# Patient Record
Sex: Male | Born: 1945 | Race: White | Hispanic: No | Marital: Married | Smoking: Former smoker
Health system: Southern US, Community
[De-identification: ages and names within clinical notes are randomized; demographics above are authoritative.]

## PROBLEM LIST (undated history)

## (undated) DIAGNOSIS — I10 Essential (primary) hypertension: Secondary | ICD-10-CM

## (undated) DIAGNOSIS — E785 Hyperlipidemia, unspecified: Secondary | ICD-10-CM

---

## 2009-01-19 ENCOUNTER — Emergency Department: Payer: Self-pay | Admitting: Internal Medicine

## 2009-05-07 ENCOUNTER — Ambulatory Visit: Payer: Self-pay | Admitting: Gastroenterology

## 2011-06-02 ENCOUNTER — Emergency Department: Payer: Self-pay | Admitting: *Deleted

## 2013-01-11 IMAGING — CT CT HEAD WITHOUT CONTRAST
2 series · 15 of 30 positions shown, 19 images · non-contrast
Comparison: none

REASON FOR EXAM: facial weakness
COMMENTS:

[Series 2: without · axial · non-contrast · 0.43mm/px · z∈[+943,+1073]mm · 13 of 32 slices shown, 17 images]
[im 3/32  brain]
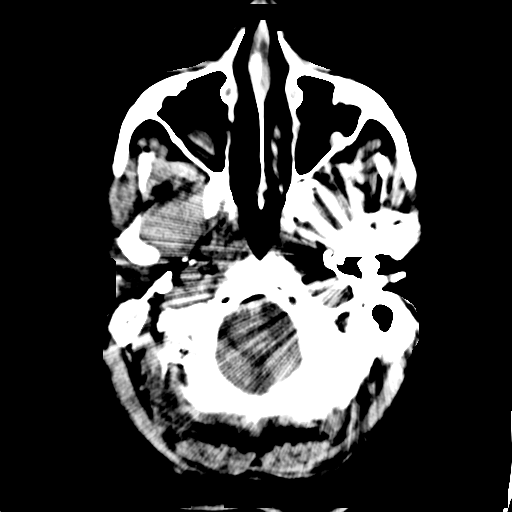
[im 3/32  bone]
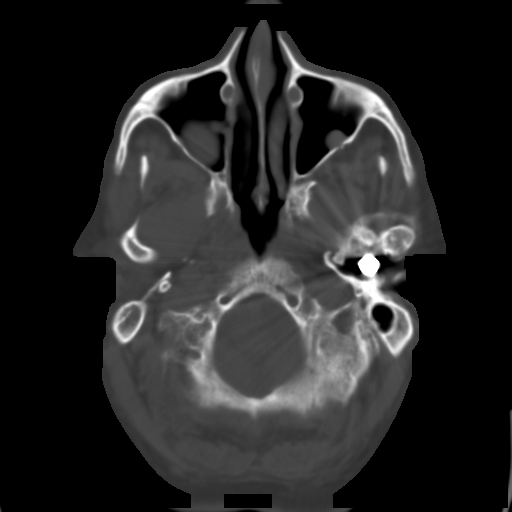
[im 5/32  brain]
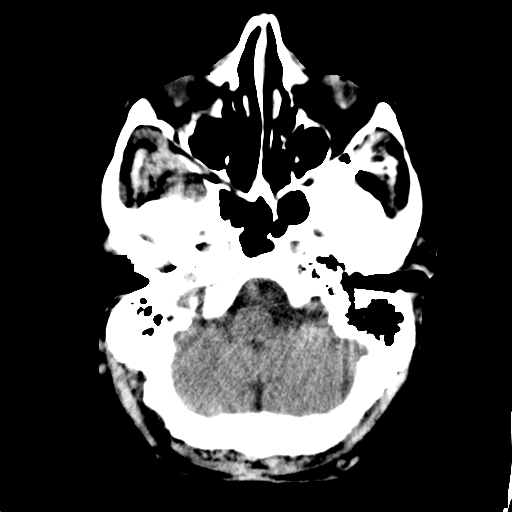
[im 7/32  brain]
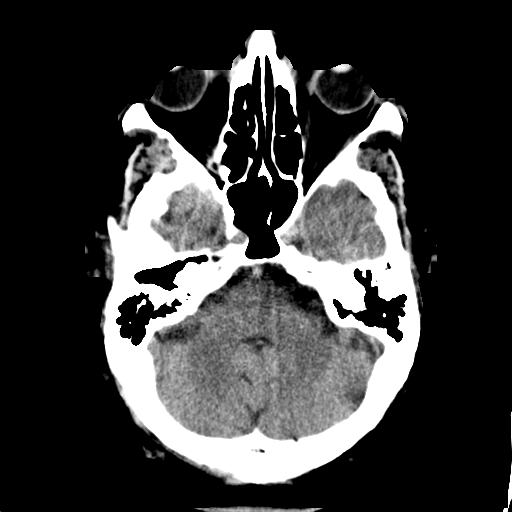
[im 9/32  brain]
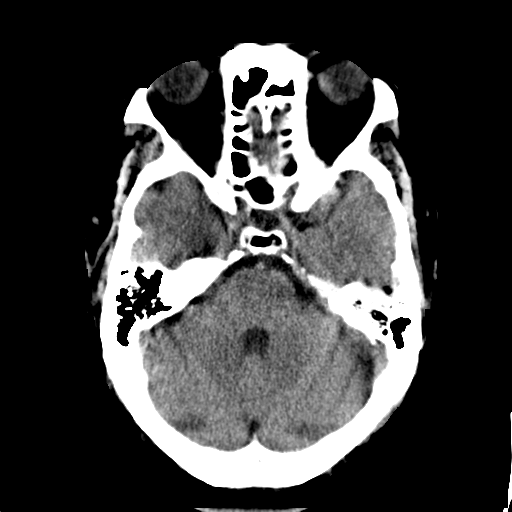
[im 12/32  brain]
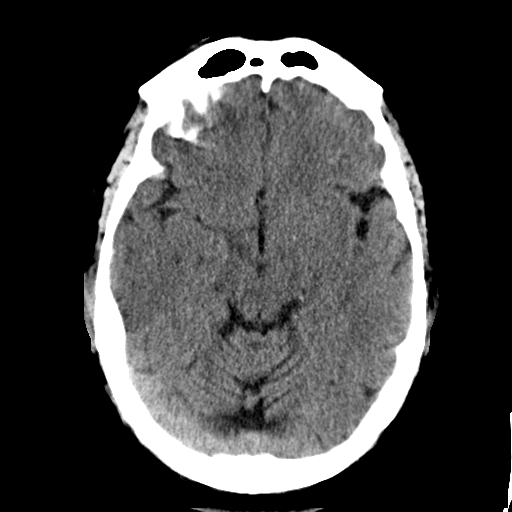
[im 12/32  bone]
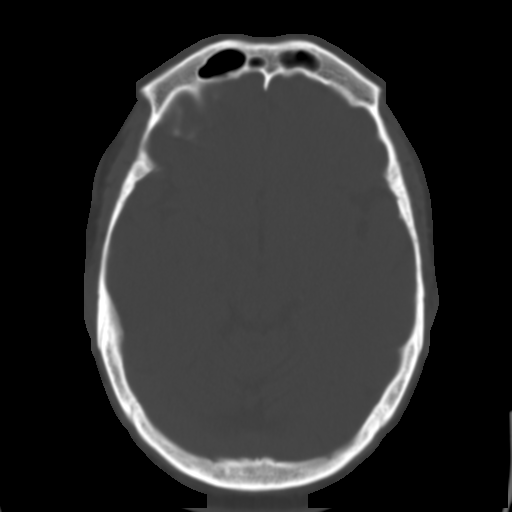
[im 14/32  brain]
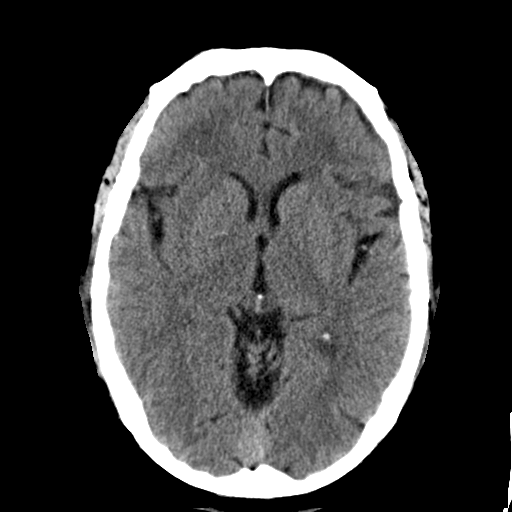
[im 16/32  brain]
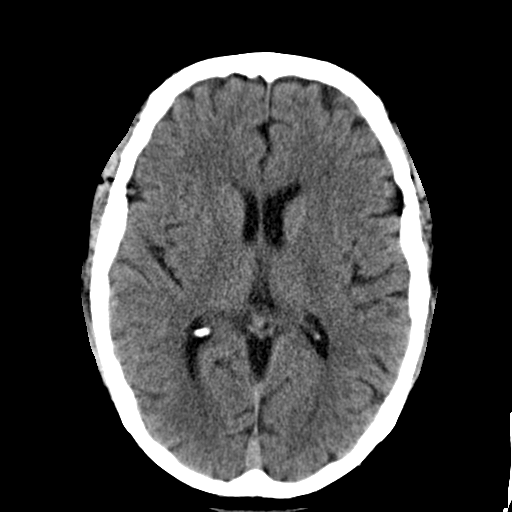
[im 18/32  brain]
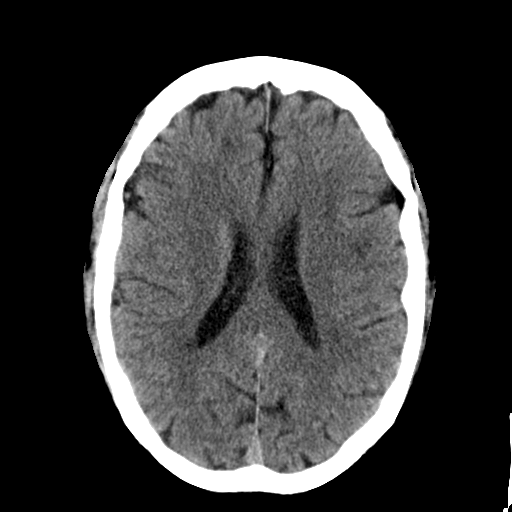
[im 20/32  brain]
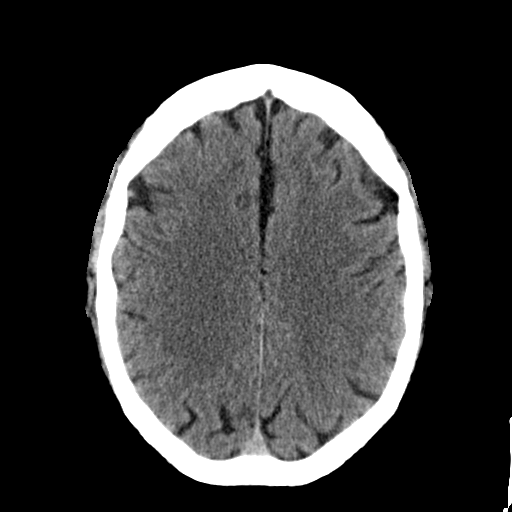
[im 20/32  bone]
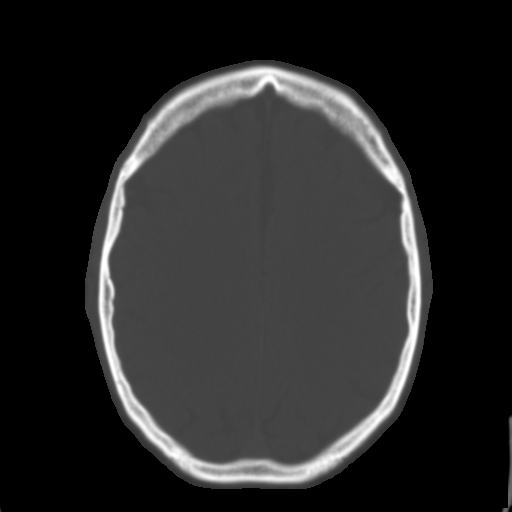
[im 23/32  brain]
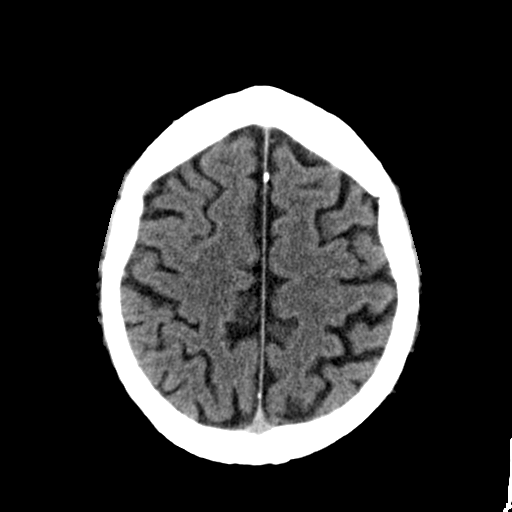
[im 25/32  brain]
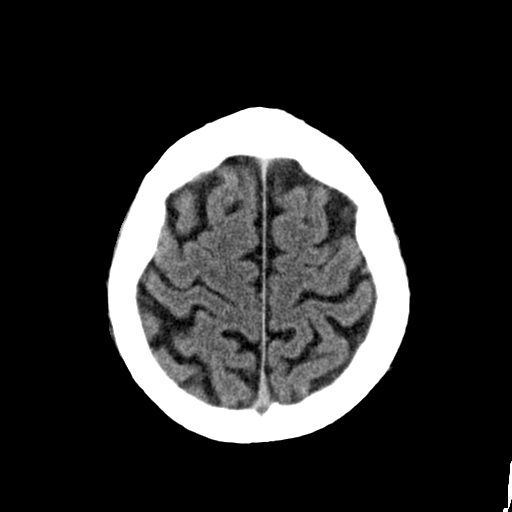
[im 27/32  brain]
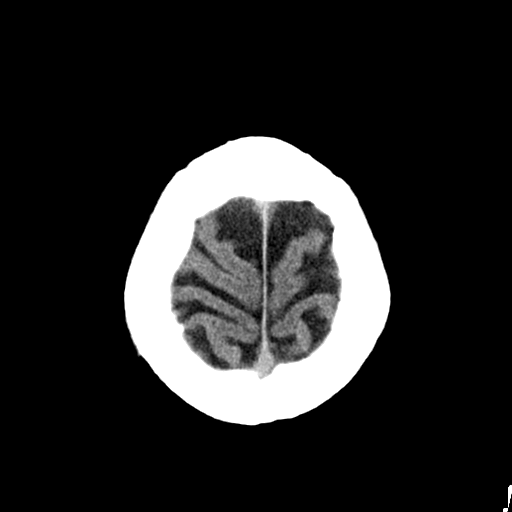
[im 29/32  brain]
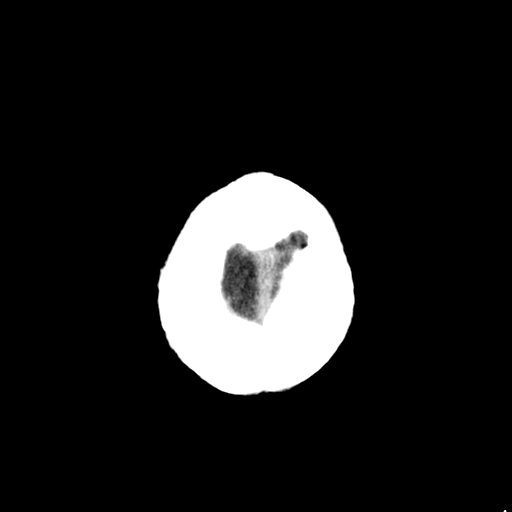
[im 29/32  bone]
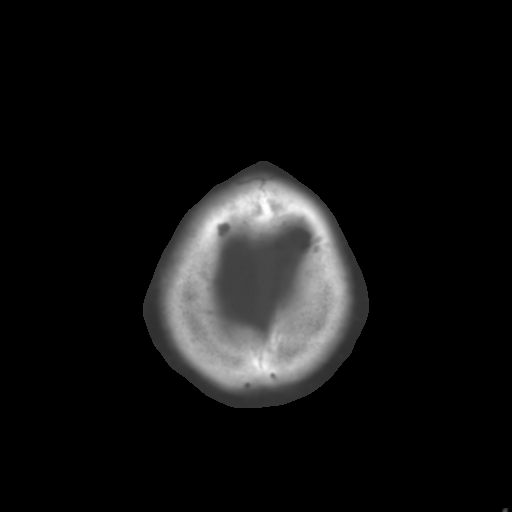

[Series 3: bone · axial · 0.43mm/px · z∈[+943,+963]mm · 2 of 32 slices shown]
[im 3/32  bone]
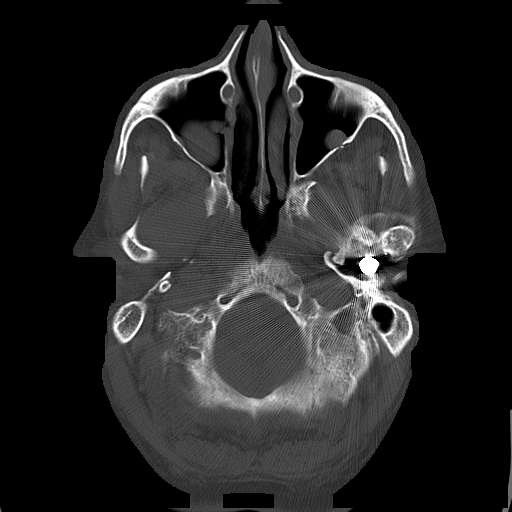
[im 7/32  bone]
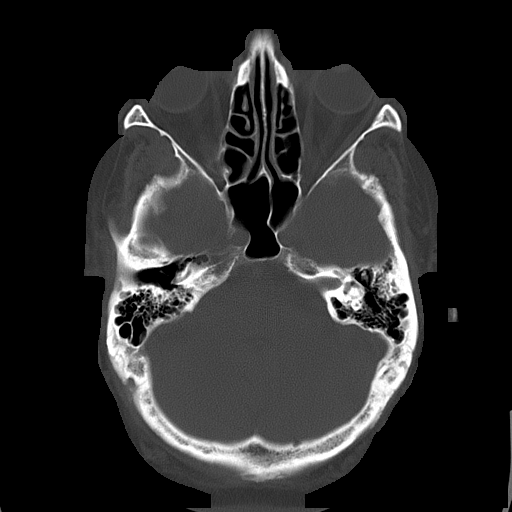

[15 of 30 positions shown; findings below may reference images not displayed]

PROCEDURE:     CT  - CT HEAD WITHOUT CONTRAST  - June 02, 2011  [DATE]

RESULT:     Axial noncontrast CT scanning was performed through the brain at
5 mm intervals and slice thicknesses.

The ventricles are normal in size and position. There is mild diffuse
cerebral and cerebellar atrophy. There is no shift of the midline. There is
no intracranial hemorrhage. I see no evidence of an evolving ischemic
infarction.

At bone window settings there is mucoperiosteal thickening in both maxillary
sinuses. The frontal and ethmoid and sphenoid sinuses are clear. The mastoid
air cells are well pneumatized. The middle ear cavities do not appear
abnormally opacified. There is no evidence of an acute skull fracture.
IMPRESSION: 1. I do not see evidence of an acute ischemic or hemorrhagic infarction.
2. There is no evidence of intracranial mass effect.
3. There is mild diffuse cerebral atrophy.

Followup imaging is available if there are strong clinical concerns of an
evolving ischemic infarction rather than development of Bell's palsy.

## 2015-02-26 ENCOUNTER — Ambulatory Visit: Admit: 2015-02-26 | Disposition: A | Discharge status: Home / Self Care | Payer: Self-pay | Admitting: Gastroenterology

## 2017-06-10 ENCOUNTER — Other Ambulatory Visit: Payer: Self-pay | Admitting: Family Medicine

## 2017-06-10 ENCOUNTER — Ambulatory Visit
Admission: RE | Admit: 2017-06-10 | Discharge: 2017-06-10 | Disposition: A | Discharge status: Home / Self Care | Payer: Medicare Other | Source: Ambulatory Visit | Attending: Family Medicine | Admitting: Family Medicine

## 2017-06-10 DIAGNOSIS — M79662 Pain in left lower leg: Secondary | ICD-10-CM | POA: Insufficient documentation

## 2017-06-10 DIAGNOSIS — R600 Localized edema: Secondary | ICD-10-CM | POA: Diagnosis not present

## 2017-11-24 IMAGING — US US EXTREM LOW VENOUS*L*
1 series · 13 of 24 positions shown · non-contrast
Comparison: None.

CLINICAL DATA: Left calf pain and edema.



[Series 1: us extrem low venous*left* · 0.09mm/px · 13 of 33 slices shown]
[im 1/33]
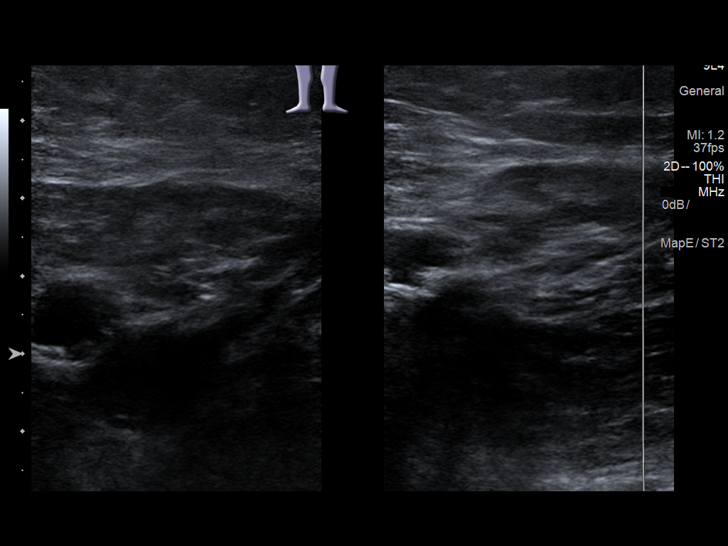
[im 3/33]
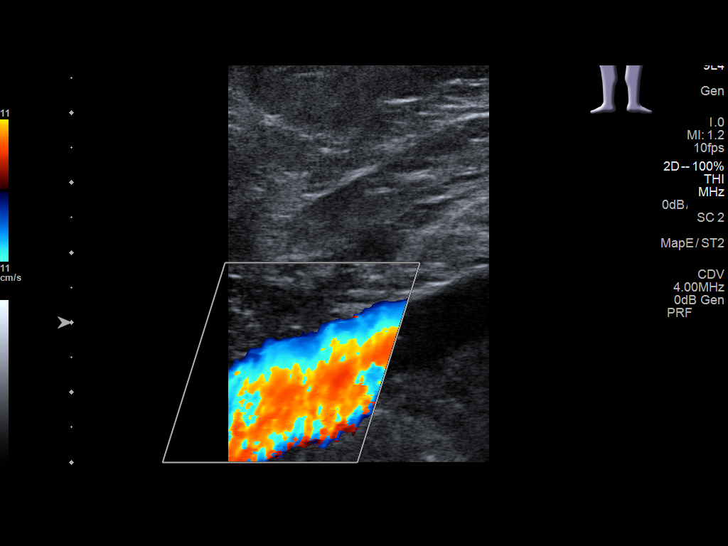
[im 6/33]
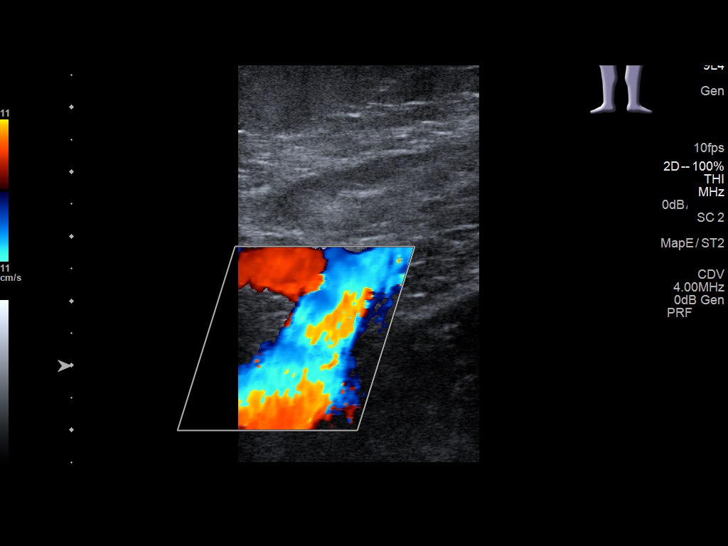
[im 9/33]
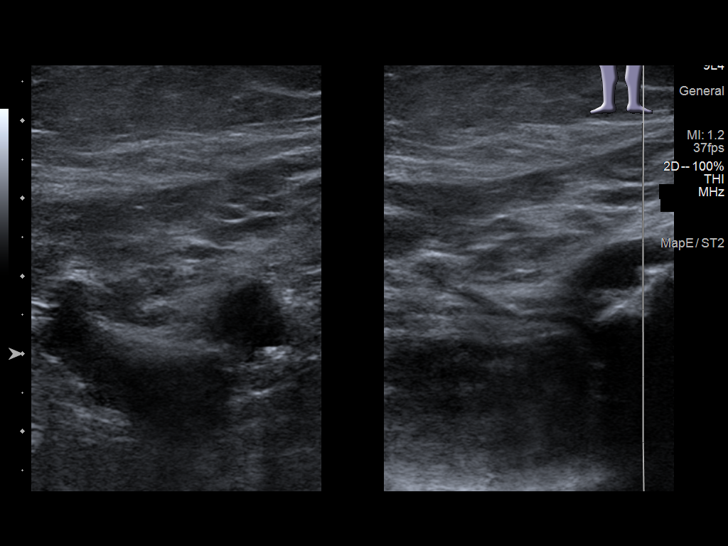
[im 12/33]
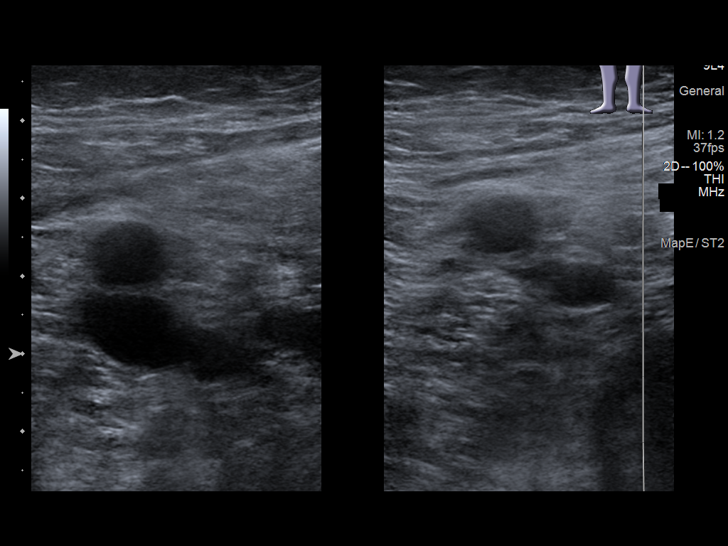
[im 14/33]
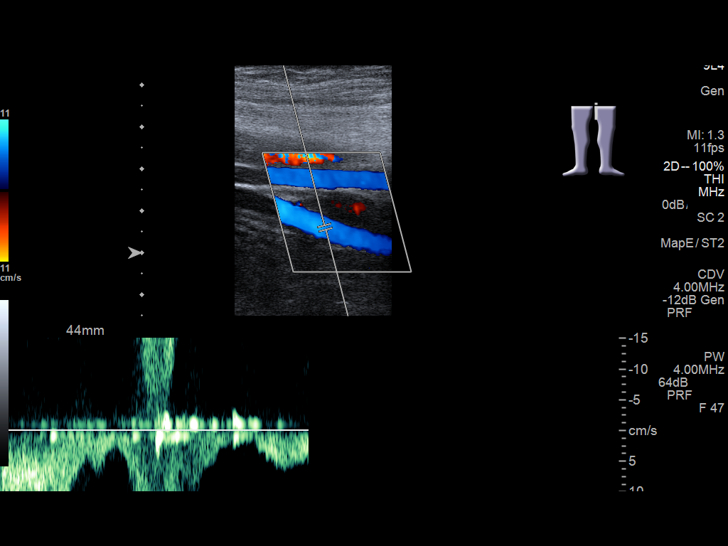
[im 17/33]
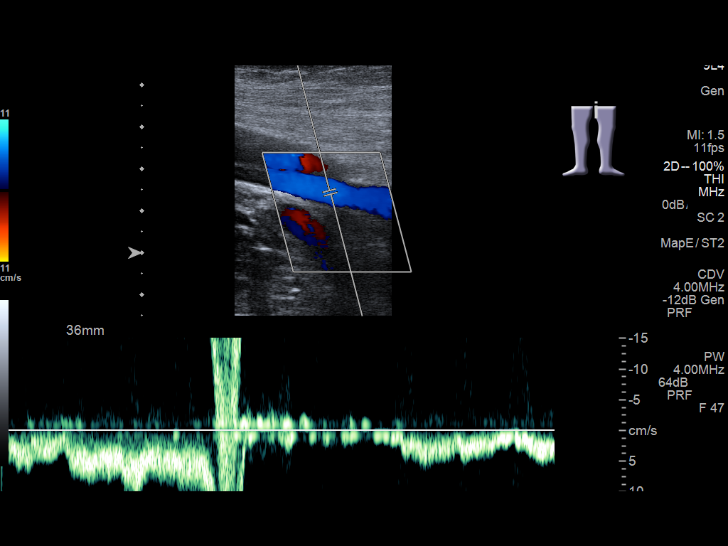
[im 19/33]
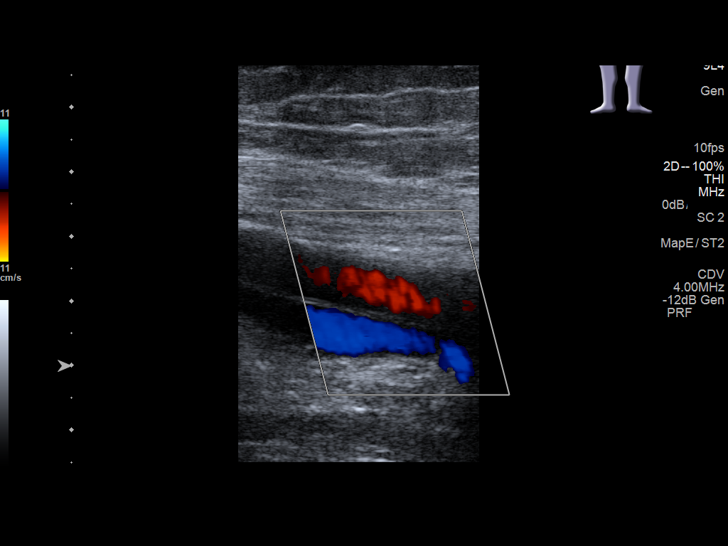
[im 21/33]
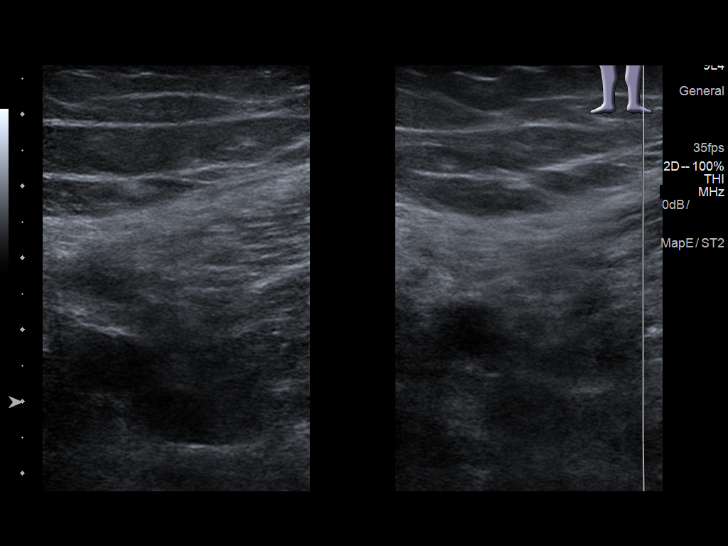
[im 24/33]
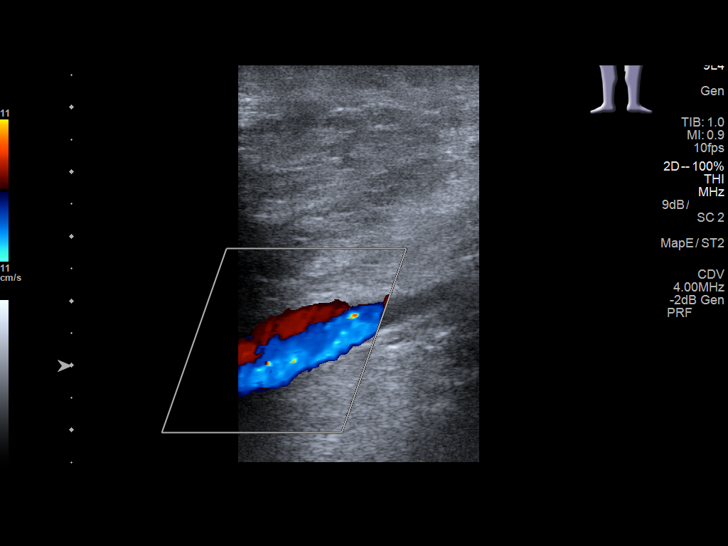
[im 27/33]
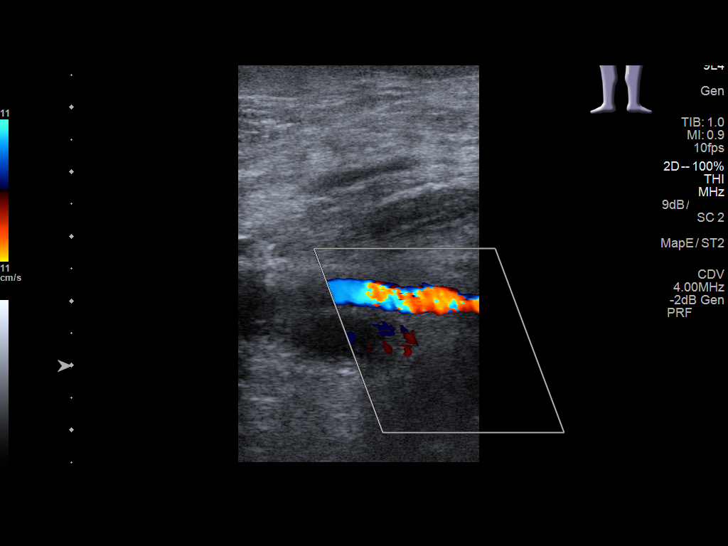
[im 30/33]
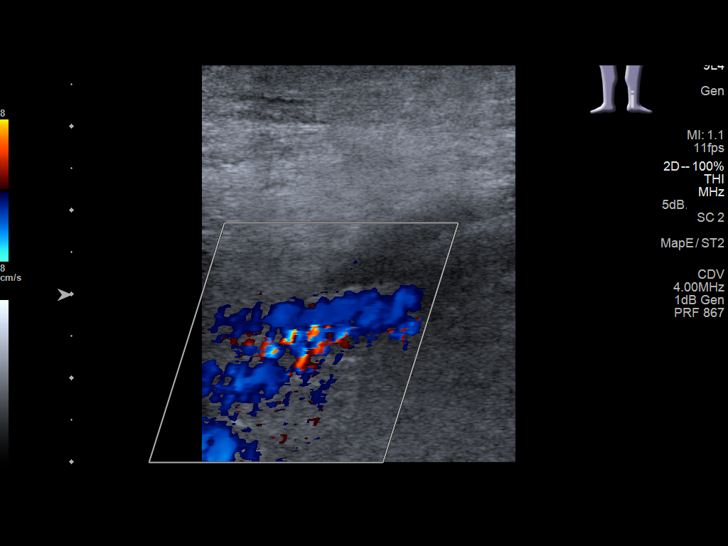
[im 33/33]
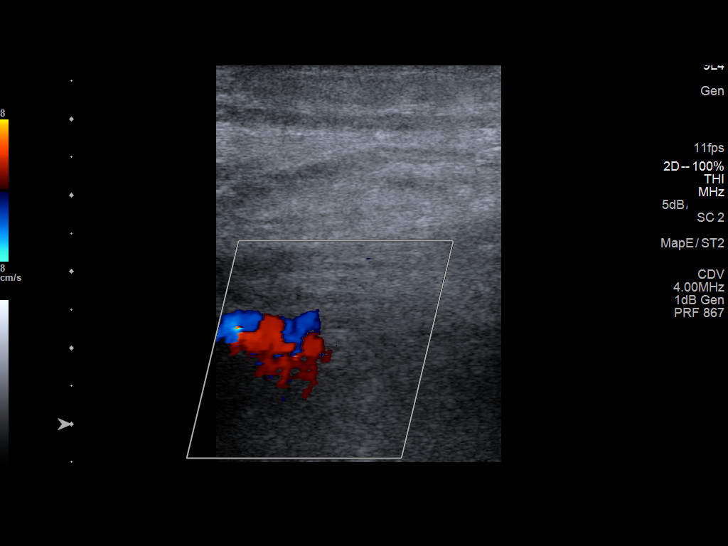

[13 of 24 positions shown; findings below may reference images not displayed]

FINDINGS: Contralateral Common Femoral Vein: Respiratory phasicity is normal
and symmetric with the symptomatic side. No evidence of thrombus.
Normal compressibility.

Common Femoral Vein: No evidence of thrombus. Normal
compressibility, respiratory phasicity and response to augmentation.

Saphenofemoral Junction: No evidence of thrombus. Normal
compressibility and flow on color Doppler imaging.

Profunda Femoral Vein: No evidence of thrombus. Normal
compressibility and flow on color Doppler imaging.

Femoral Vein: No evidence of thrombus. Normal compressibility,
respiratory phasicity and response to augmentation.

Popliteal Vein: No evidence of thrombus. Normal compressibility,
respiratory phasicity and response to augmentation.

Calf Veins: No evidence of thrombus. Normal compressibility and flow
on color Doppler imaging.

Venous Reflux:  None.

Other Findings:  None.
IMPRESSION: No evidence of DVT within the left lower extremity.

## 2020-01-31 ENCOUNTER — Other Ambulatory Visit: Payer: Self-pay | Admitting: General Surgery

## 2020-02-01 LAB — SURGICAL PATHOLOGY

## 2020-02-03 ENCOUNTER — Encounter
Admission: EM | Disposition: A | Discharge status: Other Acute Inpatient Hospital | Payer: Self-pay | Source: Home / Self Care | Attending: Internal Medicine

## 2020-02-03 ENCOUNTER — Other Ambulatory Visit: Payer: Self-pay

## 2020-02-03 ENCOUNTER — Encounter: Payer: Self-pay | Admitting: Internal Medicine

## 2020-02-03 ENCOUNTER — Inpatient Hospital Stay
Admission: EM | Admit: 2020-02-03 | Discharge: 2020-02-04 | DRG: 262 | Disposition: A | Discharge status: Other Acute Inpatient Hospital | Payer: Medicare Other | Attending: Internal Medicine | Admitting: Internal Medicine

## 2020-02-03 DIAGNOSIS — Z87891 Personal history of nicotine dependence: Secondary | ICD-10-CM

## 2020-02-03 DIAGNOSIS — M1990 Unspecified osteoarthritis, unspecified site: Secondary | ICD-10-CM | POA: Diagnosis present

## 2020-02-03 DIAGNOSIS — K5790 Diverticulosis of intestine, part unspecified, without perforation or abscess without bleeding: Secondary | ICD-10-CM | POA: Diagnosis present

## 2020-02-03 DIAGNOSIS — Z20822 Contact with and (suspected) exposure to covid-19: Secondary | ICD-10-CM | POA: Diagnosis present

## 2020-02-03 DIAGNOSIS — E669 Obesity, unspecified: Secondary | ICD-10-CM | POA: Diagnosis present

## 2020-02-03 DIAGNOSIS — R55 Syncope and collapse: Secondary | ICD-10-CM | POA: Diagnosis present

## 2020-02-03 DIAGNOSIS — I10 Essential (primary) hypertension: Secondary | ICD-10-CM | POA: Diagnosis present

## 2020-02-03 DIAGNOSIS — I442 Atrioventricular block, complete: Principal | ICD-10-CM | POA: Diagnosis present

## 2020-02-03 DIAGNOSIS — Z6829 Body mass index (BMI) 29.0-29.9, adult: Secondary | ICD-10-CM | POA: Diagnosis not present

## 2020-02-03 DIAGNOSIS — I959 Hypotension, unspecified: Secondary | ICD-10-CM | POA: Diagnosis present

## 2020-02-03 DIAGNOSIS — E785 Hyperlipidemia, unspecified: Secondary | ICD-10-CM | POA: Diagnosis present

## 2020-02-03 HISTORY — DX: Essential (primary) hypertension: I10

## 2020-02-03 HISTORY — DX: Atrioventricular block, complete: I44.2

## 2020-02-03 HISTORY — PX: TEMPORARY PACEMAKER: CATH118268

## 2020-02-03 HISTORY — DX: Hyperlipidemia, unspecified: E78.5

## 2020-02-03 LAB — TROPONIN I (HIGH SENSITIVITY)
Troponin I (High Sensitivity): 27 ng/L — ABNORMAL HIGH (ref <18)
Troponin I (High Sensitivity): 28 ng/L — ABNORMAL HIGH (ref <18)

## 2020-02-03 LAB — COMPREHENSIVE METABOLIC PANEL
ALT: 19 U/L (ref 0–44)
AST: 18 U/L (ref 15–41)
Albumin: 4 g/dL (ref 3.5–5.0)
Alkaline Phosphatase: 43 U/L (ref 38–126)
Anion gap: 6 (ref 5–15)
BUN: 55 mg/dL — ABNORMAL HIGH (ref 8–23)
CO2: 25 mmol/L (ref 22–32)
Calcium: 10.6 mg/dL — ABNORMAL HIGH (ref 8.9–10.3)
Chloride: 110 mmol/L (ref 98–111)
Creatinine, Ser: 1.69 mg/dL — ABNORMAL HIGH (ref 0.61–1.24)
GFR calc Af Amer: 45 mL/min — ABNORMAL LOW (ref >60)
GFR calc non Af Amer: 39 mL/min — ABNORMAL LOW (ref >60)
Glucose, Bld: 128 mg/dL — ABNORMAL HIGH (ref 70–99)
Potassium: 4.9 mmol/L (ref 3.5–5.1)
Sodium: 141 mmol/L (ref 135–145)
Total Bilirubin: 0.5 mg/dL (ref 0.3–1.2)
Total Protein: 6.4 g/dL — ABNORMAL LOW (ref 6.5–8.1)

## 2020-02-03 LAB — CBC
HCT: 40.5 % (ref 39.0–52.0)
Hemoglobin: 13.1 g/dL (ref 13.0–17.0)
MCH: 30.7 pg (ref 26.0–34.0)
MCHC: 32.3 g/dL (ref 30.0–36.0)
MCV: 94.8 fL (ref 80.0–100.0)
Platelets: 227 10*3/uL (ref 150–400)
RBC: 4.27 MIL/uL (ref 4.22–5.81)
RDW: 13.4 % (ref 11.5–15.5)
WBC: 10.2 10*3/uL (ref 4.0–10.5)
nRBC: 0 % (ref 0.0–0.2)

## 2020-02-03 LAB — GLUCOSE, CAPILLARY: Glucose-Capillary: 84 mg/dL (ref 70–99)

## 2020-02-03 LAB — TSH: TSH: 5.946 u[IU]/mL — ABNORMAL HIGH (ref 0.350–4.500)

## 2020-02-03 LAB — RESPIRATORY PANEL BY RT PCR (FLU A&B, COVID)
Influenza A by PCR: NEGATIVE
Influenza B by PCR: NEGATIVE
SARS Coronavirus 2 by RT PCR: NEGATIVE

## 2020-02-03 SURGERY — TEMPORARY PACEMAKER

## 2020-02-03 MED ORDER — DOPAMINE-DEXTROSE 3.2-5 MG/ML-% IV SOLN: 0.0000 ug/kg/min | INTRAVENOUS | Status: DC

## 2020-02-03 MED ORDER — ONDANSETRON HCL 4 MG/2ML IJ SOLN: 4.0000 mg | Freq: Four times a day (QID) | INTRAMUSCULAR | Status: DC | PRN

## 2020-02-03 MED ORDER — BUPIVACAINE HCL (PF) 0.5 % IJ SOLN
INTRAMUSCULAR | Status: AC
  Filled 2020-02-03: qty 30

## 2020-02-03 MED ORDER — ACETAMINOPHEN 325 MG PO TABS: 650.0000 mg | ORAL_TABLET | ORAL | Status: DC | PRN

## 2020-02-03 MED ORDER — SODIUM CHLORIDE 0.9 % IV BOLUS
1000.0000 mL | Freq: Once | INTRAVENOUS | Status: AC
  Administered 2020-02-03: 1000 mL via INTRAVENOUS

## 2020-02-03 MED ORDER — HEPARIN (PORCINE) IN NACL 1000-0.9 UT/500ML-% IV SOLN
INTRAVENOUS | Status: DC | PRN
  Administered 2020-02-03: 500 mL

## 2020-02-03 MED ORDER — BUPIVACAINE HCL (PF) 0.5 % IJ SOLN
INTRAMUSCULAR | Status: DC | PRN
  Administered 2020-02-03: 12 mL

## 2020-02-03 SURGICAL SUPPLY — 7 items
CABLE ADAPT PACING TEMP 12FT (ADAPTER) ×3 IMPLANT
KIT MANI 3VAL PERCEP (MISCELLANEOUS) IMPLANT
NEEDLE PERC 18GX7CM (NEEDLE) ×3 IMPLANT
PACK CARDIAC CATH (CUSTOM PROCEDURE TRAY) ×3 IMPLANT
SHEATH AVANTI 6FR X 11CM (SHEATH) ×3 IMPLANT
SLEEVE REPOSITIONING LENGTH 30 (MISCELLANEOUS) ×3 IMPLANT
WIRE PACING TEMP ST TIP 5 (CATHETERS) ×3 IMPLANT

## 2020-02-03 NOTE — ED Notes (Signed)
Physical copy of transfer consent form signed and sent to patient records.

## 2020-02-03 NOTE — H&P (Signed)
Name: Bill Young MRN: [id number] DOB: Jan 25, 1946    ADMISSION DATE:  02/03/2020  REFERRING MD : Dr. Kerman Passey   CHIEF COMPLAINT: Syncopal Episode   BRIEF PATIENT DESCRIPTION:  74 yo male admitted with third degree heart block requiring temporary transvenous pacemaker placement   SIGNIFICANT EVENTS/STUDIES:  03/26: Pt presented to [facility] ER following a syncopal episode, lightheadedness, and dizziness  03/26: Pt admitted to [facility] ICU with third degree heart block pending transfer to [facility] for permanent pacemaker placement (accepting physician Dr. Stanford Breed) 03/26: Pt remained bradycardic heart rate in the 20's and manual bp 104/48, activated STEMI pager pt transported to cath lab and right internal jugular temporary pacemaker placed by Dr. Saralyn Pilar   HISTORY OF PRESENT ILLNESS:   This is a 74 yo male with a PMH of Hyperlipidemia, HTN, Former Smoker, Diverticulosis, Degenerative Arthritis, and Obesity.  He presented to [facility] ER on 03/26 with c/o worsening dizziness, headaches, and lightheadedness onset of symptoms 2 days prior to presentation. The pt also reported having a syncopal episode the night of 03/25, however he is uncertain how long he was unconscious but suspects it was at least an hour.  Upon EMS arrival at pts home pts hr in the 20's, EMS administered 1 mg of iv atropine. However, he remained bradycardic hr in the 20's. He does take outpatient antihypertensives which include: 2.5 mg of amlodipine daily/losartan 100 mg daily and endorses taking vitamin C and B3. He denies taking beta blockers. He reports he had a cyst lanced on 03/23 and received subq lidocaine during the procedure, when he arrived at home he developed diaphoresis along with chills. Upon arrival to the ER EKG revealed complete heart block, hr 26 bpm with a slightly widened QRS, and without ST elevation.  Lab results revealed glucose 128, BUN 55, creatinine 1.69, calcium 10.6, troponin 27, TSH 5.946,  and Influenza PCR/COVID-19 negative.  In the ER RN unable to obtain accurate automatic bp readings, manual sbp readings ranged between 90-low 100's and pt received 1L NS bolus x1 dose.  ER physician contacted on call [facility] Cardiologist Dr. Stanford Breed who recommended transfer to [facility] for permanent pacemaker placement.  However, ICU bed unavailable at [facility] during transfer request, therefore pt required [facility] ICU admission pending bed availability at [facility]. Detailed hospital course outlined above.    PAST MEDICAL HISTORY :   has no past medical history on file.  has no past surgical history on file. Prior to Admission medications   Medication Sig Start Date End Date Taking? Authorizing Provider  amLODipine (NORVASC) 2.5 MG tablet Take 2.5 mg by mouth daily. 11/22/19  Yes [provider]  Cholecalciferol 100 MCG (4000 UT) CAPS Take 4,000 Units by mouth at bedtime.    Yes [provider]  fenofibrate 160 MG tablet Take 160 mg by mouth at bedtime.  11/22/19  Yes [provider]  losartan (COZAAR) 100 MG tablet Take 100 mg by mouth daily. 01/03/20  Yes [provider]  vitamin C (ASCORBIC ACID) 250 MG tablet Take 250 mg by mouth daily.   Yes [provider]   No Known Allergies  FAMILY HISTORY:  family history is not on file. SOCIAL HISTORY:    REVIEW OF SYSTEMS: Positives in BOLD  Constitutional: Negative for fever, chills, weight loss, malaise/fatigue and diaphoresis.  HENT: Negative for hearing loss, ear pain, nosebleeds, congestion, sore throat, neck pain, tinnitus and ear discharge.   Eyes: Negative for blurred vision,  double vision, photophobia, pain, discharge and redness.  Respiratory: Negative for cough, hemoptysis, sputum production, shortness of breath, wheezing and stridor.   Cardiovascular: Negative for chest pain, palpitations, orthopnea, claudication, leg swelling and PND.  Gastrointestinal: Negative for  heartburn, nausea, vomiting, abdominal pain, diarrhea, constipation, blood in stool and melena.  Genitourinary: Negative for dysuria, urgency, frequency, hematuria and flank pain.  Musculoskeletal: Negative for myalgias, back pain, joint pain and falls.  Skin: Negative for itching and rash.  Neurological: dizziness, tingling, tremors, sensory change, speech change, focal weakness, seizures, loss of consciousness/syncopal episode, weakness and headaches.  Endo/Heme/Allergies: Negative for environmental allergies and polydipsia. Does not bruise/bleed easily.  SUBJECTIVE:  No complaints at this time   VITAL SIGNS: Temp:  [99.1 F (37.3 C)] 99.1 F (37.3 C) (03/26 1755) Pulse Rate:  [28-49] 31 (03/26 2105) Resp:  [10-16] 13 (03/26 2105) BP: (90-158)/(50-148) 105/56 (03/26 2105) SpO2:  [91 %-100 %] 100 % (03/26 2105) Weight:  [99.8 kg] 99.8 kg (03/26 1747)  PHYSICAL EXAMINATION: General: well developed, well nourished male, NAD  Neuro: alert and oriented, follows commands, PERRLA  HEENT: supple, no JVD  Cardiovascular: third degree heart block, no R/G  Lungs: clear throughout, even, non labored Abdomen: +BS x4, soft, obese, non distended, non tender  Musculoskeletal: 1+ bilateral lower extremity edema  Skin: intact no rashes    Recent Labs  Lab 02/03/20 1750  NA 141  K 4.9  CL 110  CO2 25  BUN 55*  CREATININE 1.69*  GLUCOSE 128*   Recent Labs  Lab 02/03/20 1750  HGB 13.1  HCT 40.5  WBC 10.2  PLT 227   No results found.  ASSESSMENT / PLAN:  Third degree heart block etiology unknown  Hypotension likely cardiogenic but will r/o sepsis  Hx: Hyperlipidemia, HTN, and Obesity Continuous telemetry monitoring  Hold outpatient antihypertensives  Trend troponin's  Echo pending  Cardiology consulted appreciate input-pt pending transfer to [facility] for permanent pacemaker placement  Prn dopamine gtt to maintain map >65 and/or hr >40 bpm Sepsis workup pending-if  pct elevated will start abx therapy   Sonda Rumble, AGNP  Pulmonary/Critical Care Pager [phone] (please enter 7 digits) PCCM Consult Pager [phone] (please enter 7 digits)

## 2020-02-03 NOTE — ED Provider Notes (Signed)
[facility] Emergency Department Provider Note  Time seen: 5:52 PM  I have reviewed the triage vital signs and the nursing notes.   HISTORY  Chief Complaint Bradycardia   HPI Bill Young is a 74 y.o. male with a past medical history of hypertension presents to the emergency department for dizziness found to be bradycardic.  According to the patient for the past day or 2 he has been intermittently getting dizzy especially upon standing, today was much worse or he was feeling lightheaded and like he was going to pass out.  Patient denies any recent fever cough congestion or shortness of breath.  Patient did receive his second Covid vaccine 3 days ago.  Patient denies any chest pain.  Denies any shortness of breath.  Patient found to have a heart rate around 25 bpm by EMS given atropine by EMS without response.   Patient denies any cardiac history has never seen a cardiologist per patient.  Patient's only hypertensive medication is 2.5 mg of amlodipine daily.  No past medical history on file.  There are no problems to display for this patient.   Prior to Admission medications   Not on File    No Known Allergies  No family history on file.  Social History Social History   Tobacco Use  . Smoking status: Not on file  Substance Use Topics  . Alcohol use: Not on file  . Drug use: Not on file    Review of Systems Constitutional: Negative for fever.  Positive for dizziness/lightheadedness Cardiovascular: Negative for chest pain. Respiratory: Negative for shortness of breath. Gastrointestinal: Negative for abdominal pain Musculoskeletal: Negative for musculoskeletal complaints Neurological: Negative for headache All other ROS negative  ____________________________________________   PHYSICAL EXAM:  Constitutional: Alert and oriented. Well appearing and in no distress. Eyes: Normal exam ENT      Head: Normocephalic and atraumatic.  Mouth/Throat: Mucous membranes are moist. Cardiovascular: Regular rhythm rate around 25 bpm.  No obvious murmur. Respiratory: Normal respiratory effort without tachypnea nor retractions. Breath sounds are clear  Gastrointestinal: Soft and nontender. No distention.  Musculoskeletal: Nontender with normal range of motion in all extremities. Neurologic:  Normal speech and language. No gross focal neurologic deficits  Skin:  Skin is warm, dry and intact.  Psychiatric: Mood and affect are normal.  ____________________________________________    EKG  EKG viewed and interpreted by myself shows what appears to be a complete heart block at 26 bpm with a slightly widened QRS, normal axis, no ST elevation noted.  ____________________________________________    INITIAL IMPRESSION / ASSESSMENT AND PLAN / ED COURSE  Pertinent labs & imaging results that were available during my care of the patient were reviewed by me and considered in my medical decision making (see chart for details).   Patient presents emergency department for lightheadedness/dizziness found to be bradycardic in the 20s per EMS.  Upon arrival patient remains around 25 bpm and what appears to be a complete heart block.  No cardiac history.  Denies any chest pain no shortness of breath.  We will check labs, continue to closely monitor.  We have placed the patient on a zoll monitor in case it is needed.  Maintaining his blood pressure at this time.  We will discuss with cardiology once lab results are known.  Patient's blood pressure did appear to briefly drop however upon recheck it is 105/69 without intervention.  Patient continues to [facility] well denies any symptoms at this time.  Heart rate  remains around 25 bpm what appears to be complete heart block.  Patient's labs show a troponin of 27, appears to have renal insufficiency with a creatinine 1.6 up from 1.06 weeks ago per care everywhere review.  I spoke to Dr. Stanford Breed of  cardiology, believes the patient needs to be transferred to [facility] for pacemaker.  We are currently working on transfer for the patient.  Patient agreeable to plan of care to transfer.   I spoke with Dr. Stanford Breed once again, they are able to place the permanent pacemaker but do not currently have an ICU bed available to accept the patient to [facility]  I spoke to Dr. Saralyn Pilar, who states he will be on-call for the patient should the need arise for a temporary pacer.  Patient continues to have a heart rate around 23 bpm but also continues to have a reassuring blood pressure around 161 systolic.  Patient symptoms have essentially been ongoing for 2 days likely related to his complete heart block.  Patient appears to be stable.  I spoke to our ICU team who is agreeable to admit the patient to our ICU, while the patient is pending transfer to [facility] which may not happen until tomorrow for permanent pacemaker placement.  Dr. Stanford Breed states he will have his team see the patient in the morning to help arrange for pacemaker.  Patient agreeable to plan of care.  Patient has pacemaker pads in place to be used if needed, but has not needed them yet.   Bill Young was evaluated in Emergency Department on 02/03/2020 for the symptoms described in the history of present illness. He was evaluated in the context of the global COVID-19 pandemic, which necessitated consideration that the patient might be at risk for infection with the SARS-CoV-2 virus that causes COVID-19. Institutional protocols and algorithms that pertain to the evaluation of patients at risk for COVID-19 are in a state of rapid change based on information released by regulatory bodies including the CDC and federal and state organizations. These policies and algorithms were followed during the patient's care in the ED.  CRITICAL CARE Performed by: Harvest Dark   Total critical care time: 45 minutes  Critical care time was  exclusive of separately billable procedures and treating other patients.  Critical care was necessary to treat or prevent imminent or life-threatening deterioration.  Critical care was time spent personally by me on the following activities: development of treatment plan with patient and/or surrogate as well as nursing, discussions with consultants, evaluation of patient's response to treatment, examination of patient, obtaining history from patient or surrogate, ordering and performing treatments and interventions, ordering and review of laboratory studies, ordering and review of radiographic studies, pulse oximetry and re-evaluation of patient's condition.   ____________________________________________   FINAL CLINICAL IMPRESSION(S) / ED DIAGNOSES  Complete Heart Block   Harvest Dark, MD 02/03/20 2112

## 2020-02-03 NOTE — ED Triage Notes (Addendum)
Pt arrival via [facility] from home due to bradycardia. EMS states that the patient had a syncopal episode last night and that he's had a dull headache/felt 'swimmy' headed. Pt does not know how long he was out, but thinks he was out for about an hour last night.   Pt calls out today due to feeling lightheaded. Pt denies shob, cp, or other complaints.  Pt HR's in the 20's with EMS but BP 120/60. Pt's orthos were good per EMS. Pt walked fine to the ambulance without trouble. Pt given 1 mg atropine IV with EMS.   HR showed third degree block.  BS 144

## 2020-02-04 ENCOUNTER — Encounter (HOSPITAL_COMMUNITY)
Admission: EM | Disposition: A | Discharge status: Home / Self Care | Payer: Self-pay | Source: Other Acute Inpatient Hospital | Attending: Internal Medicine

## 2020-02-04 ENCOUNTER — Encounter: Payer: Self-pay | Admitting: Anesthesiology

## 2020-02-04 ENCOUNTER — Inpatient Hospital Stay (HOSPITAL_COMMUNITY)
Admission: EM | Admit: 2020-02-04 | Discharge: 2020-02-05 | DRG: 244 | Disposition: A | Discharge status: Home / Self Care | Payer: Medicare Other | Source: Other Acute Inpatient Hospital | Attending: Internal Medicine | Admitting: Internal Medicine

## 2020-02-04 ENCOUNTER — Encounter (HOSPITAL_COMMUNITY): Payer: Self-pay | Admitting: Internal Medicine

## 2020-02-04 ENCOUNTER — Inpatient Hospital Stay (HOSPITAL_COMMUNITY): Payer: Medicare Other

## 2020-02-04 ENCOUNTER — Other Ambulatory Visit: Payer: Self-pay

## 2020-02-04 DIAGNOSIS — Z6834 Body mass index (BMI) 34.0-34.9, adult: Secondary | ICD-10-CM | POA: Diagnosis not present

## 2020-02-04 DIAGNOSIS — R778 Other specified abnormalities of plasma proteins: Secondary | ICD-10-CM | POA: Diagnosis present

## 2020-02-04 DIAGNOSIS — R001 Bradycardia, unspecified: Secondary | ICD-10-CM | POA: Diagnosis present

## 2020-02-04 DIAGNOSIS — Z959 Presence of cardiac and vascular implant and graft, unspecified: Secondary | ICD-10-CM

## 2020-02-04 DIAGNOSIS — R9431 Abnormal electrocardiogram [ECG] [EKG]: Secondary | ICD-10-CM | POA: Diagnosis not present

## 2020-02-04 DIAGNOSIS — R55 Syncope and collapse: Secondary | ICD-10-CM | POA: Diagnosis present

## 2020-02-04 DIAGNOSIS — I10 Essential (primary) hypertension: Secondary | ICD-10-CM | POA: Diagnosis present

## 2020-02-04 DIAGNOSIS — Z888 Allergy status to other drugs, medicaments and biological substances status: Secondary | ICD-10-CM

## 2020-02-04 DIAGNOSIS — I442 Atrioventricular block, complete: Principal | ICD-10-CM

## 2020-02-04 DIAGNOSIS — Z79899 Other long term (current) drug therapy: Secondary | ICD-10-CM | POA: Diagnosis not present

## 2020-02-04 DIAGNOSIS — Z20822 Contact with and (suspected) exposure to covid-19: Secondary | ICD-10-CM | POA: Diagnosis present

## 2020-02-04 DIAGNOSIS — E785 Hyperlipidemia, unspecified: Secondary | ICD-10-CM | POA: Diagnosis present

## 2020-02-04 DIAGNOSIS — E669 Obesity, unspecified: Secondary | ICD-10-CM | POA: Diagnosis present

## 2020-02-04 DIAGNOSIS — Z950 Presence of cardiac pacemaker: Secondary | ICD-10-CM

## 2020-02-04 DIAGNOSIS — I959 Hypotension, unspecified: Secondary | ICD-10-CM | POA: Diagnosis not present

## 2020-02-04 HISTORY — PX: PACEMAKER IMPLANT: EP1218

## 2020-02-04 HISTORY — DX: Atrioventricular block, complete: I44.2

## 2020-02-04 LAB — BASIC METABOLIC PANEL
Anion gap: 11 (ref 5–15)
BUN: 43 mg/dL — ABNORMAL HIGH (ref 8–23)
CO2: 18 mmol/L — ABNORMAL LOW (ref 22–32)
Calcium: 9.8 mg/dL (ref 8.9–10.3)
Chloride: 114 mmol/L — ABNORMAL HIGH (ref 98–111)
Creatinine, Ser: 1.46 mg/dL — ABNORMAL HIGH (ref 0.61–1.24)
GFR calc Af Amer: 54 mL/min — ABNORMAL LOW (ref >60)
GFR calc non Af Amer: 47 mL/min — ABNORMAL LOW (ref >60)
Glucose, Bld: 97 mg/dL (ref 70–99)
Potassium: 5 mmol/L (ref 3.5–5.1)
Sodium: 143 mmol/L (ref 135–145)

## 2020-02-04 LAB — SURGICAL PCR SCREEN
MRSA, PCR: NEGATIVE
Staphylococcus aureus: NEGATIVE

## 2020-02-04 LAB — CBC
HCT: 40.4 % (ref 39.0–52.0)
Hemoglobin: 12.7 g/dL — ABNORMAL LOW (ref 13.0–17.0)
MCH: 30.7 pg (ref 26.0–34.0)
MCHC: 31.4 g/dL (ref 30.0–36.0)
MCV: 97.6 fL (ref 80.0–100.0)
Platelets: 204 10*3/uL (ref 150–400)
RBC: 4.14 MIL/uL — ABNORMAL LOW (ref 4.22–5.81)
RDW: 13.4 % (ref 11.5–15.5)
WBC: 9.4 10*3/uL (ref 4.0–10.5)
nRBC: 0 % (ref 0.0–0.2)

## 2020-02-04 LAB — PROCALCITONIN: Procalcitonin: <0.1 ng/mL

## 2020-02-04 LAB — LACTIC ACID, PLASMA: Lactic Acid, Venous: 1.9 mmol/L (ref 0.5–1.9)

## 2020-02-04 LAB — ECHOCARDIOGRAM COMPLETE

## 2020-02-04 LAB — PHOSPHORUS: Phosphorus: 2.7 mg/dL (ref 2.5–4.6)

## 2020-02-04 LAB — T4, FREE: Free T4: 1.08 ng/dL (ref 0.61–1.12)

## 2020-02-04 LAB — MAGNESIUM: Magnesium: 2.3 mg/dL (ref 1.7–2.4)

## 2020-02-04 LAB — MRSA PCR SCREENING
MRSA by PCR: NEGATIVE
MRSA by PCR: NEGATIVE

## 2020-02-04 LAB — TSH: TSH: 8.229 u[IU]/mL — ABNORMAL HIGH (ref 0.350–4.500)

## 2020-02-04 SURGERY — PACEMAKER IMPLANT
Anesthesia: LOCAL

## 2020-02-04 MED ORDER — FENOFIBRATE 160 MG PO TABS
160.0000 mg | ORAL_TABLET | Freq: Every day | ORAL | Status: DC
  Administered 2020-02-04 – 2020-02-05 (×2): 160 mg via ORAL
  Filled 2020-02-04 (×2): qty 1

## 2020-02-04 MED ORDER — SODIUM CHLORIDE 0.9% FLUSH: 3.0000 mL | Freq: Two times a day (BID) | INTRAVENOUS | Status: DC

## 2020-02-04 MED ORDER — CHLORHEXIDINE GLUCONATE CLOTH 2 % EX PADS
6.0000 | MEDICATED_PAD | Freq: Every day | CUTANEOUS | Status: DC
  Administered 2020-02-04: 10:00:00 6 via TOPICAL

## 2020-02-04 MED ORDER — BUPIVACAINE HCL (PF) 0.25 % IJ SOLN
INTRAMUSCULAR | Status: AC
  Filled 2020-02-04: qty 30

## 2020-02-04 MED ORDER — AMLODIPINE BESYLATE 2.5 MG PO TABS
2.5000 mg | ORAL_TABLET | Freq: Every day | ORAL | Status: DC
  Administered 2020-02-05: 2.5 mg via ORAL
  Filled 2020-02-04: qty 1

## 2020-02-04 MED ORDER — SODIUM CHLORIDE 0.9% FLUSH: 3.0000 mL | INTRAVENOUS | Status: DC | PRN

## 2020-02-04 MED ORDER — SODIUM CHLORIDE 0.9 % IV SOLN: INTRAVENOUS | Status: DC

## 2020-02-04 MED ORDER — SODIUM CHLORIDE 0.45 % IV SOLN: INTRAVENOUS | Status: DC

## 2020-02-04 MED ORDER — SODIUM CHLORIDE 0.9 % IV SOLN
INTRAVENOUS | Status: AC
  Filled 2020-02-04: qty 2

## 2020-02-04 MED ORDER — HYDROCODONE-ACETAMINOPHEN 5-325 MG PO TABS: 1.0000 | ORAL_TABLET | ORAL | Status: DC | PRN

## 2020-02-04 MED ORDER — DEXTROSE 5 % IV SOLN
3.0000 g | INTRAVENOUS | Status: AC
  Administered 2020-02-04: 3 g via INTRAVENOUS
  Filled 2020-02-04: qty 3

## 2020-02-04 MED ORDER — CEFAZOLIN SODIUM-DEXTROSE 1-4 GM/50ML-% IV SOLN
1.0000 g | Freq: Four times a day (QID) | INTRAVENOUS | Status: AC
  Administered 2020-02-04 – 2020-02-05 (×3): 1 g via INTRAVENOUS
  Filled 2020-02-04 (×3): qty 50

## 2020-02-04 MED ORDER — CHLORHEXIDINE GLUCONATE CLOTH 2 % EX PADS
6.0000 | MEDICATED_PAD | Freq: Every day | CUTANEOUS | Status: DC
  Administered 2020-02-05: 11:00:00 6 via TOPICAL

## 2020-02-04 MED ORDER — ONDANSETRON HCL 4 MG/2ML IJ SOLN: 4.0000 mg | Freq: Four times a day (QID) | INTRAMUSCULAR | Status: DC | PRN

## 2020-02-04 MED ORDER — SODIUM CHLORIDE 0.9 % IV SOLN
80.0000 mg | INTRAVENOUS | Status: AC
  Administered 2020-02-04: 14:00:00 80 mg

## 2020-02-04 MED ORDER — LIDOCAINE HCL 1 % IJ SOLN
INTRAMUSCULAR | Status: AC
  Filled 2020-02-04: qty 20

## 2020-02-04 MED ORDER — HEPARIN (PORCINE) IN NACL 1000-0.9 UT/500ML-% IV SOLN
INTRAVENOUS | Status: AC
  Filled 2020-02-04: qty 500

## 2020-02-04 MED ORDER — ACETAMINOPHEN 325 MG PO TABS: 650.0000 mg | ORAL_TABLET | ORAL | Status: DC | PRN

## 2020-02-04 MED ORDER — PERFLUTREN LIPID MICROSPHERE
1.0000 mL | INTRAVENOUS | Status: DC | PRN
  Administered 2020-02-04: 09:00:00 4 mL via INTRAVENOUS
  Filled 2020-02-04: qty 10

## 2020-02-04 MED ORDER — HEPARIN (PORCINE) IN NACL 1000-0.9 UT/500ML-% IV SOLN
INTRAVENOUS | Status: DC | PRN
  Administered 2020-02-04: 500 mL

## 2020-02-04 MED ORDER — ACETAMINOPHEN 325 MG PO TABS: 325.0000 mg | ORAL_TABLET | ORAL | Status: DC | PRN

## 2020-02-04 MED ORDER — SODIUM CHLORIDE 0.9 % IV SOLN: INTRAVENOUS | Status: DC | PRN

## 2020-02-04 MED ORDER — BUPIVACAINE HCL (PF) 0.25 % IJ SOLN
INTRAMUSCULAR | Status: DC | PRN
  Administered 2020-02-04: 60 mL

## 2020-02-04 MED ORDER — IOHEXOL 350 MG/ML SOLN
INTRAVENOUS | Status: DC | PRN
  Administered 2020-02-04: 15 mL via INTRAVENOUS

## 2020-02-04 MED ORDER — HEPARIN SODIUM (PORCINE) 5000 UNIT/ML IJ SOLN: 5000.0000 [IU] | Freq: Three times a day (TID) | INTRAMUSCULAR | Status: DC

## 2020-02-04 MED ORDER — SODIUM CHLORIDE 0.9 % IV SOLN: 250.0000 mL | INTRAVENOUS | Status: DC | PRN

## 2020-02-04 SURGICAL SUPPLY — 7 items
CABLE SURGICAL S-101-97-12 (CABLE) ×2 IMPLANT
LEAD TENDRIL MRI 52CM LPA1200M (Lead) ×1 IMPLANT
LEAD TENDRIL MRI 58CM LPA1200M (Lead) ×1 IMPLANT
PACEMAKER ASSURITY DR-RF (Pacemaker) ×1 IMPLANT
PAD PRO RADIOLUCENT 2001M-C (PAD) ×2 IMPLANT
SHEATH 8FR PRELUDE SNAP 13 (SHEATH) ×2 IMPLANT
TRAY PACEMAKER INSERTION (PACKS) ×2 IMPLANT

## 2020-02-04 NOTE — Progress Notes (Signed)
eLink Physician-Brief Progress Note Patient Name: Bill Young DOB: December 15, 1945 MRN: [id number]   Date of Service  02/04/2020  HPI/Events of Note  Pt with complete heart block s/p temporary trans-venous pacemaker placement. Post-procedure he is hemodynamically stable.  eICU Interventions  New Patient Evaluation completed.        Bill Young 02/04/2020, 12:09 AM

## 2020-02-04 NOTE — Progress Notes (Signed)
   Progress Note   Subjective   Doing well today, the patient denies CP or SOB.  He feels "much better" with temporary pacing.  No new concerns  Inpatient Medications    Scheduled Meds: . Chlorhexidine Gluconate Cloth  6 each Topical Daily  . fenofibrate  160 mg Oral Daily  . heparin  5,000 Units Subcutaneous Q8H   Continuous Infusions: . sodium chloride     PRN Meds: sodium chloride, acetaminophen, ondansetron (ZOFRAN) IV, perflutren lipid microspheres (DEFINITY) IV suspension   Vital Signs    Vitals:   02/04/20 0600 02/04/20 0700 02/04/20 0800 02/04/20 0900  BP: 115/72 129/78 130/79 (!) 151/85  Pulse: 70 70 70 70  Resp: 14 14 13 15  Temp:  98 F (36.7 C)    TempSrc:  Oral    SpO2: 94% 95% 98% 96%    Intake/Output Summary (Last 24 hours) at 02/04/2020 1036 Last data filed at 02/04/2020 0800 Gross per 24 hour  Intake 0 ml  Output 500 ml  Net -500 ml   There were no vitals filed for this visit.  Telemetry    V paced - Personally Reviewed  Physical Exam   GEN- The patient is well appearing, alert and oriented x 3 today.   Head- normocephalic, atraumatic Eyes-  Sclera clear, conjunctiva pink Ears- hearing intact Oropharynx- clear Neck- supple,  R IJ catheter with temp wire in place Lungs-  normal work of breathing Heart- Regular rate and rhythm  (paced) GI- soft  Extremities- no clubbing, cyanosis, or edema  MS- no significant deformity or atrophy Skin- no rash or lesion Psych- euthymic mood, full affect Neuro- strength and sensation are intact   Labs    Chemistry Recent Labs  Lab 02/03/20 1750 02/04/20 0306  NA 141 143  K 4.9 5.0  CL 110 114*  CO2 25 18*  GLUCOSE 128* 97  BUN 55* 43*  CREATININE 1.69* 1.46*  CALCIUM 10.6* 9.8  PROT 6.4*  --   ALBUMIN 4.0  --   AST 18  --   ALT 19  --   ALKPHOS 43  --   BILITOT 0.5  --   GFRNONAA 39* 47*  GFRAA 45* 54*  ANIONGAP 6 11     Hematology Recent Labs  Lab 02/03/20 1750 02/04/20 0306   WBC 10.2 9.4  RBC 4.27 4.14*  HGB 13.1 12.7*  HCT 40.5 40.4  MCV 94.8 97.6  MCH 30.7 30.7  MCHC 32.3 31.4  RDW 13.4 13.4  PLT 227 204    ekgs in epic are reviewed Echo from today is reviewed  Assessment & Plan    1.  Complete heart block The patient presents with symptomatic complete heart block.  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  No reversible causes are found.  He had syncope on Thursday. The patient has symptomatic bradycardia.  I would therefore recommend pacemaker implantation at this time.  Risks, benefits, alternatives to pacemaker implantation were discussed in detail with the patient today. The patient understands that the risks include but are not limited to bleeding, infection, pneumothorax, perforation, tamponade, vascular damage, renal failure, MI, stroke, death,  and lead dislodgement and wishes to proceed.  He is tentatively scheduled for 1 pm today.  2. HTN Stable No change required today   Leandria Thier MD, FACC 02/04/2020 10:36 AM  

## 2020-02-04 NOTE — Discharge Summary (Signed)
ELECTROPHYSIOLOGY PROCEDURE DISCHARGE SUMMARY    Patient ID: Bill Young,  MRN: [id number], DOB/AGE: [birth year] 74 y.o.  Admit date: 02/04/2020 Discharge date: 02/05/2020  Primary Care Physician: Maryland Pink, MD Electrophysiologist: Servando Kyllonen  Primary Discharge Diagnosis:  Symptomatic complete heart block status post pacemaker implantation this admission  Secondary Discharge Diagnosis:  1.  HTN 2.  Hyperlipidemia 3.  Obesity  Allergies  Allergen Reactions  . Lidocaine     Chills and generalized diaphoresis      Procedures This Admission:  1.  Implantation of a STJ dual chamber PPM on 02/04/20 by Dr Rayann Heman.  See op note for full details. There were no immediate post procedure complications. 2.  CXR on 02/05/20 demonstrated no pneumothorax status post device implantation.   Brief HPI/Hospital Course:  Bill Young is a 74 y.o. male with the above past medical history. He presented to [facility] with a 2 day history of dizziness and headaches. He also had a syncopal spell 02/02/20.  On arrival, he was found to be in complete heart block. Temp wire was placed and he was transferred to [facility] for further evaluation.  The patient has had symptomatic bradycardia without reversible causes identified.  Risks, benefits, and alternatives to PPM implantation were reviewed with the patient who wished to proceed.   The patient was admitted and underwent implantation of a STJ dual chamber PPM with details as outlined above.  He  was monitored on telemetry overnight which demonstrated SR with V pacing.  Left chest was without hematoma or ecchymosis.  The device was interrogated and found to be functioning normally.  Device interrogation personally reviewed by Dr Rayann Heman.  CXR was obtained and demonstrated no pneumothorax status post device implantation.  Wound care, arm mobility, and restrictions were reviewed with the patient.  The patient was examined and considered stable for  discharge to home.    Physical Exam: Vitals:   02/05/20 0600 02/05/20 0700 02/05/20 0800 02/05/20 0900  BP: 137/71 (!) 146/80 115/63 140/71  Pulse: 63 64 68 61  Resp: 14 17 14 13   Temp:      TempSrc:      SpO2: 95% 96% 94% 96%  Weight:      Height:        GEN- The patient is well appearing, alert and oriented x 3 today.   HEENT: normocephalic, atraumatic; sclera clear, conjunctiva pink; hearing intact; oropharynx clear; neck supple, no JVP Lungs-   normal work of breathing.   Heart- Regular rate and rhythm  GI- soft  Extremities- no clubbing, cyanosis, or edema; DP/PT/radial pulses 2+ bilaterally MS- no significant deformity or atrophy Skin- warm and dry, no rash or lesion, left chest without hematoma/ecchymosis Psych- euthymic mood, full affect Neuro- strength and sensation are intact   Labs:   Lab Results  Component Value Date   WBC 9.4 02/04/2020   HGB 12.7 (L) 02/04/2020   HCT 40.4 02/04/2020   MCV 97.6 02/04/2020   PLT 204 02/04/2020    Recent Labs  Lab 02/03/20 1750 02/04/20 0306 02/05/20 0140  NA 141   < > 143  K 4.9   < > 4.0  CL 110   < > 111  CO2 25   < > 23  BUN 55*   < > 26*  CREATININE 1.69*   < > 1.16  CALCIUM 10.6*   < > 9.6  PROT 6.4*  --   --   BILITOT 0.5  --   --  ALKPHOS 43  --   --   ALT 19  --   --   AST 18  --   --   GLUCOSE 128*   < > 96   < > = values in this interval not displayed.    Discharge Medications:  Allergies as of 02/05/2020      Reactions   Lidocaine    Chills and generalized diaphoresis       Medication List    TAKE these medications   amLODipine 2.5 MG tablet Commonly known as: NORVASC Take 2.5 mg by mouth daily.   fenofibrate 160 MG tablet Take 160 mg by mouth daily.   losartan 100 MG tablet Commonly known as: COZAAR Take 100 mg by mouth daily.       Disposition:   Follow-up Information    [facility] Follow up on 02/23/2020.   Specialty: Cardiology Why: at 10AM for wound  check Contact information: [address] [place] [address] [phone]       [name], MD Follow up on 05/09/2020.   Specialty: Cardiology Why: at 3:30PM  Contact information: [address] [place] [address] [phone]           Duration of Discharge Encounter: Greater than 30 minutes including physician time.  Randolm Idol MD 02/05/2020 9:51 AM

## 2020-02-04 NOTE — Progress Notes (Signed)
Report called to [facility] RN. [facility] at bedside and report given. All patient belongings including clothes, cell phone, wallet, dentures, and eye glasses sent with patient/EMS.

## 2020-02-04 NOTE — H&P (Signed)
Cardiology Admission History and Physical:   Patient ID: Bill Young MRN: [id number]; DOB: Dec 31, 1945   Admission date: 02/04/2020  Primary Care Provider: Maryland Pink, MD Primary Cardiologist: No primary care provider on file.  Primary Electrophysiologist:  None   Chief Complaint:  Syncope  Patient Profile:   Bill Young is a 73 y.o. male with HTN, HLD, obesity presents with syncope.   History of Present Illness:   Bill Young describes progressively worsening dizziness, headaches, and lightheadedness for the past 2 days. On the night of 3/25 he experienced a syncopal episode with unknown duration of down time. EMS was called and on arrival the patient's heart rate was found to be in the 20's. The patient received 1 mg of IV atropine with no effect. He was brought to [facility] ED.   At [facility], the patient was found to be in complete heart block with a ventricular escape rhythm in the 20's. His labs were significant for a minimally elevated troponin (27), normal TSH, COVID negative. His blood pressures were found to be low and thus he was brought to the cath lab for temporary transvenous pacer placement. This was placed and the patient was transferred to [facility] for eventual permanent pacemaker placement.   On arrival to [facility], the patient states he feels much better now with his TVP placed. He is asymptomatic.   Heart Pathway Score:     Past Medical History:  Diagnosis Date  . Hyperlipidemia   . Hypertension      Medications Prior to Admission:   Prior to Admission medications   Medication Sig Start Date End Date Taking? Authorizing Provider  amLODipine (NORVASC) 2.5 MG tablet Take 2.5 mg by mouth daily.   Yes [provider]  fenofibrate 160 MG tablet Take 160 mg by mouth daily.   Yes [provider]  losartan (COZAAR) 100 MG tablet Take 100 mg by mouth daily.   Yes [provider]     Allergies:     Allergies  Allergen Reactions  . Lidocaine     Chills and generalized diaphoresis     Social History:   Social History   Socioeconomic History  . Marital status: Married    Spouse name: Not on file  . Number of children: Not on file  . Years of education: Not on file  . Highest education level: Not on file  Occupational History  . Not on file  Tobacco Use  . Smoking status: Not on file  Substance and Sexual Activity  . Alcohol use: Not on file  . Drug use: Not on file  . Sexual activity: Not on file  Other Topics Concern  . Not on file  Social History Narrative  . Not on file   Social Determinants of Health   Financial Resource Strain:   . Difficulty of Paying Living Expenses:   Food Insecurity:   . Worried About Charity fundraiser in the Last Year:   . Arboriculturist in the Last Year:   Transportation Needs:   . Film/video editor (Medical):   [name] Lack of Transportation (Non-Medical):   Physical Activity:   . Days of Exercise per Week:   . Minutes of Exercise per Session:   Stress:   . Feeling of Stress :   Social Connections:   . Frequency of Communication with Friends and Family:   . Frequency of Social Gatherings with Friends and Family:   . Attends Religious  Services:   . Active Member of Clubs or Organizations:   . Attends Banker Meetings:   [name] Marital Status:   Intimate Partner Violence:   . Fear of Current or Ex-Partner:   . Emotionally Abused:   [name] Physically Abused:   . Sexually Abused:     Family History:   The patient's family history is not on file.    ROS:  Please see the history of present illness.  All other ROS reviewed and negative.     Physical Exam/Data:   Vitals:   02/04/20 0200 02/04/20 0230  BP:  131/65  Pulse:  70  Resp:  13  Temp: 98.1 F (36.7 C) 98.1 F (36.7 C)  TempSrc: Oral Oral  SpO2:  98%   No intake or output data in the 24 hours ending 02/04/20 0307 Last 3 Weights 02/03/2020  Weight (lbs)  220 lb  Weight (kg) 99.791 kg     There is no height or weight on file to calculate BMI.  General:  Well nourished, well developed, in no acute distress HEENT: normal Lymph: no adenopathy Neck: no JVD Endocrine:  No thryomegaly Vascular: No carotid bruits; FA pulses 2+ bilaterally without bruits  Cardiac:  normal S1, S2; RRR; no murmur  Lungs:  clear to auscultation bilaterally, no wheezing, rhonchi or rales  Abd: soft, nontender, no hepatomegaly  Ext: 2+ pitting edema to just below the knees bilaterally and symmetric Musculoskeletal:  No deformities, BUE and BLE strength normal and equal Skin: warm and dry  Neuro:  CNs 2-12 intact, no focal abnormalities noted Psych:  Normal affect    EKG:  The ECG that was done at presentation to [place] was personally reviewed and demonstrates sinus rhythm with complete heart block and ventricular rhythm in the 20's  Relevant CV Studies: None  Laboratory Data:  High Sensitivity Troponin:   Recent Labs  Lab 02/03/20 1750 02/03/20 1933  TROPONINIHS 27* 28*      Chemistry Recent Labs  Lab 02/03/20 1750  NA 141  K 4.9  CL 110  CO2 25  GLUCOSE 128*  BUN 55*  CREATININE 1.69*  CALCIUM 10.6*  GFRNONAA 39*  GFRAA 45*  ANIONGAP 6    Recent Labs  Lab 02/03/20 1750  PROT 6.4*  ALBUMIN 4.0  AST 18  ALT 19  ALKPHOS 43  BILITOT 0.5   Hematology Recent Labs  Lab 02/03/20 1750  WBC 10.2  RBC 4.27  HGB 13.1  HCT 40.5  MCV 94.8  MCH 30.7  MCHC 32.3  RDW 13.4  PLT 227   BNPNo results for input(s): BNP, PROBNP in the last 168 hours.  DDimer No results for input(s): DDIMER in the last 168 hours.   Radiology/Studies:  CARDIAC CATHETERIZATION  Result Date: 02/03/2020 Successful temporary transvenous pacemaker via right internal jugular vein  {  Assessment and Plan:  Bill Young  is a 74 y.o. male with HTN, HLD, obesity presents with syncope, found to have complete heart block. Now s/p TVP placement and  transfer to [facility].   #) Complete Heart Block: unclear precipitant. Troponin minimally elevated likely in setting of acute arrythmia. Patient physically active at baseline with no limitation to suggest hemodynamically significant CAD. No infectious signs/symptoms. No systemic symptoms suggestive of infiltrative process. Await echo.  - echo in AM - TVP output set to 42mA (capture 3.43mA on arrival to 2H) @ 70bpm - thyroid studies  - EP eval in AM  #) HTN: -  hold home amlodipine and losartan given hypotension prior to arrival  #) HLD:  - cont home fibrate  Severity of Illness: The appropriate patient status for this patient is INPATIENT. Inpatient status is judged to be reasonable and necessary in order to provide the required intensity of service to ensure the patient's safety. The patient's presenting symptoms, physical exam findings, and initial radiographic and laboratory data in the context of their chronic comorbidities is felt to place them at high risk for further clinical deterioration. Furthermore, it is not anticipated that the patient will be medically stable for discharge from the hospital within 2 midnights of admission. The following factors support the patient status of inpatient.   " The patient's presenting symptoms include syncope. " The worrisome physical exam findings include bradycardia. " The initial radiographic and laboratory data are worrisome because of complete heart block. " The chronic co-morbidities include obesity, HTN.   * I certify that at the point of admission it is my clinical judgment that the patient will require inpatient hospital care spanning beyond 2 midnights from the point of admission due to high intensity of service, high risk for further deterioration and high frequency of surveillance required.*    For questions or updates, please contact [facility] Please consult [email or web address] for contact info under        Signed, Rosario Jacks, MD  02/04/2020 3:07 AM

## 2020-02-04 NOTE — Discharge Summary (Signed)
Physician Discharge Summary  Patient ID: Bill Young MRN: [id number] DOB/AGE: 1946-01-14 74 y.o.  Admit date: 02/03/2020 Discharge date: 02/04/2020               DISCHARGE SUMMARY   Bill Young is a 74 y.o. y/o male with a PMH of Hyperlipidemia, HTN, Former Smoker, Diverticulosis, Degenerative Arthritis, and Obesity.  He presented to [facility] ER on 03/26 with c/o worsening dizziness, headaches, and lightheadedness onset of symptoms 2 days prior to presentation. The pt also reported having a syncopal episode the night of 03/25, however he is uncertain how long he was unconscious but suspects it was at least an hour.  Upon EMS arrival at pts home pts hr in the 20's, EMS administered 1 mg of iv atropine. However, he remained bradycardic hr in the 20's. He does take outpatient antihypertensives which include: 2.5 mg of amlodipine daily/losartan 100 mg daily and endorses taking vitamin C and B3. He denies taking beta blockers. He reports he had a cyst lanced on 03/23 and received subq lidocaine during the procedure, when he arrived at home he developed diaphoresis along with chills. Upon arrival to the ER EKG revealed complete heart block, hr 26 bpm with a slightly widened QRS, and without ST elevation.  Lab results revealed glucose 128, BUN 55, creatinine 1.69, calcium 10.6, troponin 27, TSH 5.946, and Influenza PCR/COVID-19 negative.  In the ER RN unable to obtain accurate automatic bp readings, manual sbp readings ranged between 90-low 100's and pt received 1L NS bolus x1 dose.  ER physician contacted on call [facility] Cardiologist Dr. Stanford Breed who recommended transfer to [facility] for permanent pacemaker placement.  However, ICU bed unavailable at [facility] during transfer request, therefore pt required [facility] ICU admission pending bed availability at [facility]. However, while awaiting [facility] ICU bed admission pt intermittently hypotensive STEMI pager activated and right internal  jugular temporary transvenous pacemaker placed per Dr. Saralyn Pilar.   SIGNIFICANT DIAGNOSTIC STUDIES/SIGNIFICANT EVENTS: 03/26: Pt presented to [facility] ER following a syncopal episode, lightheadedness, and dizziness  03/26: Pt admitted to [facility] ICU with third degree heart block pending transfer to [facility] for permanent pacemaker placement (accepting physician Dr. Stanford Breed) 03/26: Pt remained bradycardic heart rate in the 20's and manual bp 104/48, activated STEMI pager pt transported to cath lab and right internal jugular temporary pacemaker placed by Dr. Saralyn Pilar  MICRO DATA  Blood x2 03/27<< Influenza PCR/COVID-19>>negative   ANTIBIOTICS None   CONSULTS Intensivist  Cardiology   TUBES / LINES Right internal jugular temporary transvenous pacemaker 03/27>>  Discharge Exam: General: well developed, well nourished male, NAD  Neuro: alert and oriented, follows commands, PERRLA  HEENT: supple, no JVD  Cardiovascular: ventricular paced, no R/G  Lungs: clear throughout, even, non labored Abdomen: +BS x4, soft, obese, non distended, non tender  Musculoskeletal: 1+ bilateral lower extremity edema  Skin: mid back small lateral incision site with stiches well approximated/clean/dry/intact     Vitals:   02/03/20 2153 02/03/20 2200 02/03/20 2215 02/04/20 0000  BP: (!) 118/50  (!) 151/91 (!) 142/74  Pulse: (!) 26 (!) 28 (!) 29 70  Resp: 10 13 11 14   Temp:    98.4 F (36.9 C)  TempSrc:    Oral  SpO2: 100% 100% 100% 100%  Weight:      Height:         Discharge Labs  BMET Recent Labs  Lab 02/03/20 1750  NA 141  K 4.9  CL 110  CO2 25  GLUCOSE 128*  BUN 55*  CREATININE 1.69*  CALCIUM 10.6*    CBC Recent Labs  Lab 02/03/20 1750  HGB 13.1  HCT 40.5  WBC 10.2  PLT 227    Anti-Coagulation No results for input(s): INR in the last 168 hours.        Allergies as of 02/04/2020      Reactions   Lidocaine    Chills and generalized diaphoresis        Medication List    STOP taking these medications   amLODipine 2.5 MG tablet Commonly known as: NORVASC   Cholecalciferol 100 MCG (4000 UT) Caps   fenofibrate 160 MG tablet   losartan 100 MG tablet Commonly known as: COZAAR   vitamin C 250 MG tablet Commonly known as: ASCORBIC ACID     TAKE these medications   acetaminophen 325 MG tablet Commonly known as: TYLENOL Take 2 tablets (650 mg total) by mouth every 4 (four) hours as needed for mild pain (temp > 101.5).   DOPamine 3.2-5 MG/ML-% infusion Commonly known as: INTROPIN Inject 0-1,996 mcg/min into the vein continuous.        Disposition: Transfer to [facility]   Sonda Rumble, [place]  Pulmonary/Critical Care Pager [phone] (please enter 7 digits) PCCM Consult Pager [phone] (please enter 7 digits)

## 2020-02-04 NOTE — Progress Notes (Signed)
  Echocardiogram 2D Echocardiogram with definity has been performed.  Leta Jungling M  02/04/2020, 9:06 AM

## 2020-02-04 NOTE — Interval H&P Note (Signed)
History and Physical Interval Note:  02/04/2020 1:10 PM  Bill Young  has presented today for surgery, with the diagnosis of complete heart block.  The various methods of treatment have been discussed with the patient and family. After consideration of risks, benefits and other options for treatment, the patient has consented to  Procedure(s): PACEMAKER IMPLANT (N/A) as a surgical intervention.  The patient's history has been reviewed, patient examined, no change in status, stable for surgery.  I have reviewed the patient's chart and labs.  Questions were answered to the patient's satisfaction.     Bill Young

## 2020-02-04 NOTE — H&P (View-Only) (Signed)
   Progress Note   Subjective   Doing well today, the patient denies CP or SOB.  He feels "much better" with temporary pacing.  No new concerns  Inpatient Medications    Scheduled Meds: . Chlorhexidine Gluconate Cloth  6 each Topical Daily  . fenofibrate  160 mg Oral Daily  . heparin  5,000 Units Subcutaneous Q8H   Continuous Infusions: . sodium chloride     PRN Meds: sodium chloride, acetaminophen, ondansetron (ZOFRAN) IV, perflutren lipid microspheres (DEFINITY) IV suspension   Vital Signs    Vitals:   02/04/20 0600 02/04/20 0700 02/04/20 0800 02/04/20 0900  BP: 115/72 129/78 130/79 (!) 151/85  Pulse: 70 70 70 70  Resp: 14 14 13 15   Temp:  98 F (36.7 C)    TempSrc:  Oral    SpO2: 94% 95% 98% 96%    Intake/Output Summary (Last 24 hours) at 02/04/2020 1036 Last data filed at 02/04/2020 0800 Gross per 24 hour  Intake 0 ml  Output 500 ml  Net -500 ml   There were no vitals filed for this visit.  Telemetry    V paced - Personally Reviewed  Physical Exam   GEN- The patient is well appearing, alert and oriented x 3 today.   Head- normocephalic, atraumatic Eyes-  Sclera clear, conjunctiva pink Ears- hearing intact Oropharynx- clear Neck- supple,  R IJ catheter with temp wire in place Lungs-  normal work of breathing Heart- Regular rate and rhythm  (paced) GI- soft  Extremities- no clubbing, cyanosis, or edema  MS- no significant deformity or atrophy Skin- no rash or lesion Psych- euthymic mood, full affect Neuro- strength and sensation are intact   Labs    Chemistry Recent Labs  Lab 02/03/20 1750 02/04/20 0306  NA 141 143  K 4.9 5.0  CL 110 114*  CO2 25 18*  GLUCOSE 128* 97  BUN 55* 43*  CREATININE 1.69* 1.46*  CALCIUM 10.6* 9.8  PROT 6.4*  --   ALBUMIN 4.0  --   AST 18  --   ALT 19  --   ALKPHOS 43  --   BILITOT 0.5  --   GFRNONAA 39* 47*  GFRAA 45* 54*  ANIONGAP 6 11     Hematology Recent Labs  Lab 02/03/20 1750 02/04/20 0306   WBC 10.2 9.4  RBC 4.27 4.14*  HGB 13.1 12.7*  HCT 40.5 40.4  MCV 94.8 97.6  MCH 30.7 30.7  MCHC 32.3 31.4  RDW 13.4 13.4  PLT 227 204    ekgs in epic are reviewed Echo from today is reviewed  Assessment & Plan    1.  Complete heart block The patient presents with symptomatic complete heart block.  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  No reversible causes are found.  He had syncope on Thursday. The patient has symptomatic bradycardia.  I would therefore recommend pacemaker implantation at this time.  Risks, benefits, alternatives to pacemaker implantation were discussed in detail with the patient today. The patient understands that the risks include but are not limited to bleeding, infection, pneumothorax, perforation, tamponade, vascular damage, renal failure, MI, stroke, death,  and lead dislodgement and wishes to proceed.  He is tentatively scheduled for 1 pm today.  2. HTN Stable No change required today   Saturday MD, [facility] 02/04/2020 10:36 AM

## 2020-02-05 ENCOUNTER — Inpatient Hospital Stay (HOSPITAL_COMMUNITY): Payer: Medicare Other

## 2020-02-05 LAB — BASIC METABOLIC PANEL
Anion gap: 9 (ref 5–15)
BUN: 26 mg/dL — ABNORMAL HIGH (ref 8–23)
CO2: 23 mmol/L (ref 22–32)
Calcium: 9.6 mg/dL (ref 8.9–10.3)
Chloride: 111 mmol/L (ref 98–111)
Creatinine, Ser: 1.16 mg/dL (ref 0.61–1.24)
GFR calc Af Amer: >60 mL/min (ref >60)
GFR calc non Af Amer: >60 mL/min (ref >60)
Glucose, Bld: 96 mg/dL (ref 70–99)
Potassium: 4 mmol/L (ref 3.5–5.1)
Sodium: 143 mmol/L (ref 135–145)

## 2020-02-05 NOTE — Progress Notes (Signed)
Occasionally inappropriate pacer spikes noted, intermittently on monitor. No failure to pace noted. Heart rate maintained greater than 60. BP stable. Patient denies symptoms.  MD Charna Busman made aware, 2 view xray changed to portable. Xray done. Cardiology to follow up.

## 2020-02-05 NOTE — Discharge Instructions (Signed)
    Supplemental Discharge Instructions for  Pacemaker Patients  Activity No heavy lifting or vigorous activity with your left/right arm for 6 to 8 weeks.  Do not raise your left/right arm above your head for one week.  Gradually raise your affected arm as drawn below.           __     02/08/20            02/09/20                              02/10/20                      02/11/20  NO DRIVING for 1 week    ; you may begin driving on 12/12/46    .  WOUND CARE - Keep the wound area clean and dry.  Do not get this area wet for one week. No showers for one week; you may shower on  02/11/20   . - The tape/steri-strips on your wound will fall off; do not pull them off.  No bandage is needed on the site.  DO  NOT apply any creams, oils, or ointments to the wound area. - If you notice any drainage or discharge from the wound, any swelling or bruising at the site, or you develop a fever > 101? F after you are discharged home, call the office at once.  Special Instructions - You are still able to use cellular telephones; use the ear opposite the side where you have your pacemaker/defibrillator.  Avoid carrying your cellular phone near your device. - When traveling through airports, show security personnel your identification card to avoid being screened in the metal detectors.  Ask the security personnel to use the hand wand. - Avoid arc welding equipment, MRI testing (magnetic resonance imaging), TENS units (transcutaneous nerve stimulators).  Call the office for questions about other devices. - Avoid electrical appliances that are in poor condition or are not properly grounded. - Microwave ovens are safe to be near or to operate.

## 2020-02-06 ENCOUNTER — Encounter: Payer: Self-pay | Admitting: Cardiology

## 2020-02-06 MED FILL — Bupivacaine HCl Preservative Free (PF) Inj 0.25%: INTRAMUSCULAR | Qty: 30 | Status: AC

## 2020-02-06 MED FILL — Heparin Sod (Porcine)-NaCl IV Soln 1000 Unit/500ML-0.9%: INTRAVENOUS | Qty: 500 | Status: AC

## 2020-02-09 LAB — CULTURE, BLOOD (ROUTINE X 2)
Culture: NO GROWTH
Culture: NO GROWTH

## 2020-02-22 ENCOUNTER — Telehealth: Payer: Self-pay

## 2020-02-22 NOTE — Telephone Encounter (Signed)
I tried to get the pt monitor to work but was unsuccessful. He has an appointment with the device clinic tomorrow. I told him to bring his monitor in to get help sending the transmission.

## 2020-02-23 ENCOUNTER — Other Ambulatory Visit: Payer: Self-pay

## 2020-02-23 ENCOUNTER — Ambulatory Visit (INDEPENDENT_AMBULATORY_CARE_PROVIDER_SITE_OTHER): Payer: Medicare Other | Admitting: *Deleted

## 2020-02-23 DIAGNOSIS — I442 Atrioventricular block, complete: Secondary | ICD-10-CM | POA: Diagnosis not present

## 2020-02-23 NOTE — Patient Instructions (Signed)
Call [employer] tech support when get home and they will assist in troubleshooting the monitor.

## 2020-02-24 LAB — CUP PACEART INCLINIC DEVICE CHECK
Battery Remaining Longevity: 81 mo
Battery Voltage: 3.05 V
Brady Statistic RA Percent Paced: 81 %
Brady Statistic RV Percent Paced: 99.16 %
Date Time Interrogation Session: 20210415101000
Implantable Lead Implant Date: 20210327
Implantable Lead Implant Date: 20210327
Implantable Lead Location: 753859
Implantable Lead Location: 753860
Implantable Pulse Generator Implant Date: 20210327
Lead Channel Impedance Value: 462.5 Ohm
Lead Channel Impedance Value: 587.5 Ohm
Lead Channel Pacing Threshold Amplitude: 0.75 V
Lead Channel Pacing Threshold Amplitude: 1 V
Lead Channel Pacing Threshold Pulse Width: 0.5 ms
Lead Channel Pacing Threshold Pulse Width: 0.5 ms
Lead Channel Sensing Intrinsic Amplitude: 1.8 mV
Lead Channel Sensing Intrinsic Amplitude: 8.6 mV
Lead Channel Setting Pacing Amplitude: 1 V
Lead Channel Setting Pacing Amplitude: 3.5 V
Lead Channel Setting Pacing Pulse Width: 0.5 ms
Lead Channel Setting Sensing Sensitivity: 4 mV
Pulse Gen Model: 2272
Pulse Gen Serial Number: 3814642

## 2020-02-24 NOTE — Progress Notes (Signed)
Wound check appointment. Steri-strips removed. Wound without redness or edema. Incision edges approximated, wound well healed. Normal device function. Thresholds, sensing, and impedances consistent with implant measurements. Device programmed at 3.5V/auto capture programmed on for extra safety margin until 3 month visit. Histogram distribution appropriate for patient and level of activity. No mode switches or high ventricular rates noted. Patient educated about wound care, arm mobility, lifting restrictions.Monitor education done . Cell service at home is unreliable, given contact # for [employer] tech services to troubleshoot monitor at home. ROV with Dr Johney Frame 05/09/20 and next remote scheduled for 05/07/20.

## 2020-05-07 ENCOUNTER — Ambulatory Visit (INDEPENDENT_AMBULATORY_CARE_PROVIDER_SITE_OTHER): Payer: Medicare Other | Admitting: *Deleted

## 2020-05-07 DIAGNOSIS — I442 Atrioventricular block, complete: Secondary | ICD-10-CM | POA: Diagnosis not present

## 2020-05-08 LAB — CUP PACEART REMOTE DEVICE CHECK
Battery Remaining Longevity: 80 mo
Battery Remaining Percentage: 95.5 %
Battery Voltage: 3.01 V
Brady Statistic AP VP Percent: 62 %
Brady Statistic AP VS Percent: 1 %
Brady Statistic AS VP Percent: 32 %
Brady Statistic AS VS Percent: 1 %
Brady Statistic RA Percent Paced: 58 %
Brady Statistic RV Percent Paced: 94 %
Date Time Interrogation Session: 20210628020015
Implantable Lead Implant Date: 20210327
Implantable Lead Implant Date: 20210327
Implantable Lead Location: 753859
Implantable Lead Location: 753860
Implantable Pulse Generator Implant Date: 20210327
Lead Channel Impedance Value: 450 Ohm
Lead Channel Impedance Value: 580 Ohm
Lead Channel Pacing Threshold Amplitude: 1 V
Lead Channel Pacing Threshold Amplitude: 1.125 V
Lead Channel Pacing Threshold Pulse Width: 0.5 ms
Lead Channel Pacing Threshold Pulse Width: 0.5 ms
Lead Channel Sensing Intrinsic Amplitude: 1 mV
Lead Channel Sensing Intrinsic Amplitude: 12 mV
Lead Channel Setting Pacing Amplitude: 1.375
Lead Channel Setting Pacing Amplitude: 3.5 V
Lead Channel Setting Pacing Pulse Width: 0.5 ms
Lead Channel Setting Sensing Sensitivity: 4 mV
Pulse Gen Model: 2272
Pulse Gen Serial Number: 3814642

## 2020-05-09 ENCOUNTER — Encounter: Payer: Self-pay | Admitting: Internal Medicine

## 2020-05-09 ENCOUNTER — Other Ambulatory Visit: Payer: Self-pay

## 2020-05-09 ENCOUNTER — Ambulatory Visit (INDEPENDENT_AMBULATORY_CARE_PROVIDER_SITE_OTHER): Payer: Medicare Other | Admitting: Internal Medicine

## 2020-05-09 VITALS — BP 168/80 | HR 97 | Ht 72.0 in | Wt 277.8 lb

## 2020-05-09 DIAGNOSIS — E782 Mixed hyperlipidemia: Secondary | ICD-10-CM

## 2020-05-09 DIAGNOSIS — I10 Essential (primary) hypertension: Secondary | ICD-10-CM | POA: Diagnosis not present

## 2020-05-09 DIAGNOSIS — Z950 Presence of cardiac pacemaker: Secondary | ICD-10-CM

## 2020-05-09 DIAGNOSIS — I442 Atrioventricular block, complete: Secondary | ICD-10-CM | POA: Diagnosis not present

## 2020-05-09 NOTE — Progress Notes (Signed)
    PCP: Jerl Mina, MD   Primary EP:  Dr Johney Frame  Bill Young is a 74 y.o. male who presents today for routine electrophysiology followup.  Since his pacemaker implant, the patient reports doing very well.  He is pleased with current state.  Today, he denies symptoms of palpitations, chest pain, shortness of breath,   dizziness, presyncope, or syncope. +minor edema.  The patient is otherwise without complaint today.   Past Medical History:  Diagnosis Date  . Complete heart block (HCC) 02/04/2020  . Hyperlipidemia   . Hypertension   . Third degree heart block (HCC) 02/03/2020   ROS- all systems are reviewed and negative except as per HPI above  Current Outpatient Medications  Medication Sig Dispense Refill  . amLODipine (NORVASC) 2.5 MG tablet Take 2.5 mg by mouth daily.    . fenofibrate 160 MG tablet Take 160 mg by mouth daily.    [name] losartan (COZAAR) 100 MG tablet Take 100 mg by mouth daily.     No current facility-administered medications for this visit.    Physical Exam: Vitals:   05/09/20 1628  BP: (!) 168/80  Pulse: 97  SpO2: 95%  Weight: 277 lb 12.8 oz (126 kg)  Height: 6' (1.829 m)    GEN- The patient is well appearing, alert and oriented x 3 today.   Head- normocephalic, atraumatic Eyes-  Sclera clear, conjunctiva pink Ears- hearing intact Oropharynx- clear Lungs-  normal work of breathing Chest- pacemaker pocket is well healed Heart- Regular rate and rhythm  Extremities- no clubbing, cyanosis, or edema  Pacemaker interrogation- reviewed in detail today,  See PACEART report  ekg tracing ordered today is personally reviewed and shows sinus with V pacing  Assessment and Plan:  1. Symptomatic complete heart block Normal pacemaker function See Pace Art report No changes today he is device dependant today  2. HTN Stable No change required today  3. HL Stable No change required today   Risks, benefits and potential toxicities for  medications prescribed and/or refilled reviewed with patient today.   Return to see EP APP in a year  [name] MD, [facility] 05/09/2020 4:38 PM

## 2020-05-09 NOTE — Progress Notes (Signed)
Remote pacemaker transmission.   

## 2020-05-09 NOTE — Patient Instructions (Signed)
Medication Instructions:  Your physician recommends that you continue on your current medications as directed. Please refer to the Current Medication list given to you today.  *If you need a refill on your cardiac medications before your next appointment, please call your pharmacy*  Lab Work: None ordered.  If you have labs (blood work) drawn today and your tests are completely normal, you will receive your results only by: [name] MyChart Message (if you have MyChart) OR . A paper copy in the mail If you have any lab test that is abnormal or we need to change your treatment, we will call you to review the results.  Testing/Procedures: None ordered.  Follow-Up: At [facility], you and your health needs are our priority.  As part of our continuing mission to provide you with exceptional heart care, we have created designated Provider Care Teams.  These Care Teams include your primary Cardiologist (physician) and Advanced Practice Providers (APPs -  Physician Assistants and Nurse Practitioners) who all work together to provide you with the care you need, when you need it.  We recommend signing up for the patient portal called "MyChart".  Sign up information is provided on this After Visit Summary.  MyChart is used to connect with patients for Virtual Visits (Telemedicine).  Patients are able to view lab/test results, encounter notes, upcoming appointments, etc.  Non-urgent messages can be sent to your provider as well.   To learn more about what you can do with MyChart, go to [email or web address].    Your next appointment:   Your physician wants you to follow-up in: 1 year with Otilio Saber. You will receive a reminder letter in the mail two months in advance. If you don't receive a letter, please call our office to schedule the follow-up appointment.  Remote monitoring is used to monitor your Pacemaker from home. This monitoring reduces the number of office visits required to check your  device to one time per year. It allows [place] to keep an eye on the functioning of your device to ensure it is working properly. You are scheduled for a device check from home on 08/06/2020. You may send your transmission at any time that day. If you have a wireless device, the transmission will be sent automatically. After your physician reviews your transmission, you will receive a postcard with your next transmission date.  Other Instructions:

## 2020-05-10 LAB — CUP PACEART INCLINIC DEVICE CHECK
Battery Remaining Longevity: 112 mo
Battery Voltage: 3.01 V
Brady Statistic RA Percent Paced: 57 %
Brady Statistic RV Percent Paced: 94 %
Date Time Interrogation Session: 20210630163200
Implantable Lead Implant Date: 20210327
Implantable Lead Implant Date: 20210327
Implantable Lead Location: 753859
Implantable Lead Location: 753860
Implantable Pulse Generator Implant Date: 20210327
Lead Channel Impedance Value: 462.5 Ohm
Lead Channel Impedance Value: 600 Ohm
Lead Channel Pacing Threshold Amplitude: 1 V
Lead Channel Pacing Threshold Amplitude: 1 V
Lead Channel Pacing Threshold Pulse Width: 0.5 ms
Lead Channel Pacing Threshold Pulse Width: 0.5 ms
Lead Channel Sensing Intrinsic Amplitude: 1.4 mV
Lead Channel Setting Pacing Amplitude: 1.25 V
Lead Channel Setting Pacing Amplitude: 1.875
Lead Channel Setting Pacing Pulse Width: 0.5 ms
Lead Channel Setting Sensing Sensitivity: 4 mV
Pulse Gen Model: 2272
Pulse Gen Serial Number: 3814642

## 2020-08-06 ENCOUNTER — Ambulatory Visit (INDEPENDENT_AMBULATORY_CARE_PROVIDER_SITE_OTHER): Payer: Medicare Other | Admitting: Emergency Medicine

## 2020-08-06 DIAGNOSIS — I442 Atrioventricular block, complete: Secondary | ICD-10-CM

## 2020-08-08 ENCOUNTER — Telehealth: Payer: Self-pay | Admitting: Internal Medicine

## 2020-08-08 LAB — CUP PACEART REMOTE DEVICE CHECK
Battery Remaining Longevity: 117 mo
Battery Remaining Percentage: 95.5 %
Battery Voltage: 3.01 V
Brady Statistic AP VP Percent: 47 %
Brady Statistic AP VS Percent: 1 %
Brady Statistic AS VP Percent: 47 %
Brady Statistic AS VS Percent: 1.2 %
Brady Statistic RA Percent Paced: 42 %
Brady Statistic RV Percent Paced: 94 %
Date Time Interrogation Session: 20210927020014
Implantable Lead Implant Date: 20210327
Implantable Lead Implant Date: 20210327
Implantable Lead Location: 753859
Implantable Lead Location: 753860
Implantable Pulse Generator Implant Date: 20210327
Lead Channel Impedance Value: 440 Ohm
Lead Channel Impedance Value: 560 Ohm
Lead Channel Pacing Threshold Amplitude: 0.625 V
Lead Channel Pacing Threshold Amplitude: 0.875 V
Lead Channel Pacing Threshold Pulse Width: 0.5 ms
Lead Channel Pacing Threshold Pulse Width: 0.5 ms
Lead Channel Sensing Intrinsic Amplitude: 1.1 mV
Lead Channel Sensing Intrinsic Amplitude: 6.7 mV
Lead Channel Setting Pacing Amplitude: 1.125
Lead Channel Setting Pacing Amplitude: 1.625
Lead Channel Setting Pacing Pulse Width: 0.5 ms
Lead Channel Setting Sensing Sensitivity: 4 mV
Pulse Gen Model: 2272
Pulse Gen Serial Number: 3814642

## 2020-08-08 NOTE — Telephone Encounter (Signed)
Patient is requesting to speak with clinical staff from our device clinic. Per patient, his phone line was down on 08/06/20 and he was unable to send in transmission. However, he states when he called the office, he spoke with someone who made him aware that his transmission was received. Patient would like to verify that this information is correct, as he states nothing on MyChart indicates that his transmission was received. Attempted to contact device at both numbers to transfer patient - got fast busy dial tone. Please return call to advise.

## 2020-08-08 NOTE — Progress Notes (Signed)
Remote pacemaker transmission.   

## 2020-08-08 NOTE — Telephone Encounter (Signed)
Transmission received 08/06/20. Discussed with patient we did receive his transmission and showed normal findings. Patient states he has issues with his pone lines where he lives. Advised patient to call us if he has any questions or concerns. Verbalized understanding.

## 2020-09-03 ENCOUNTER — Telehealth: Payer: Self-pay | Admitting: Student

## 2020-09-03 NOTE — Telephone Encounter (Signed)
appt made for 10/28 per pt request

## 2020-09-03 NOTE — Telephone Encounter (Signed)
Patient returned phone call,  Advised of what we are seeing.  Pt is currently asymptomatic.  At time of call he was driving and not able to send a new transmission.  Advised of recommendation for ASAP consult in AF clinic.  Pt agreeable.

## 2020-09-03 NOTE — Telephone Encounter (Signed)
Remote alert with new paroxysmal AF, including on presenting.  Called to discuss symptoms and recommend prompt AF clinic follow up.   LMOM with CB#.

## 2020-09-05 NOTE — Progress Notes (Signed)
Primary Care Physician: Jerl Mina, MD Primary Electrophysiologist: Dr Johney Frame Referring Physician: Device clinic/Andy Lanna Poche   Bill Young is a 74 y.o. male with a history of CHB s/p PPM, HLD, HTN, and paroxysmal atrial fibrillation who presents for follow up in the [facility].  The patient was initially diagnosed with atrial fibrillation 09/03/20 on a remote device alert. He was asymptomatic. Patient has a CHADS2VASC score of 2. He denies snoring or alcohol use. He remains in afib today and is unaware of his arrhythmia. He does admit he has gained a significant amount of weight since his PPM was placed.   Today, he denies symptoms of palpitations, chest pain, shortness of breath, orthopnea, PND, lower extremity edema, dizziness, presyncope, syncope, snoring, daytime somnolence, bleeding, or neurologic sequela. The patient is tolerating medications without difficulties and is otherwise without complaint today.    Atrial Fibrillation Risk Factors:  he does not have symptoms or diagnosis of sleep apnea. he does not have a history of rheumatic fever. he does not have a history of alcohol use. The patient does not have a history of early familial atrial fibrillation or other arrhythmias.  he has a BMI of Body mass index is 39.36 kg/m.[name] Filed Weights   09/06/20 0826  Weight: 131.6 kg    No family history on file.   Atrial Fibrillation Management history:  Previous antiarrhythmic drugs: none Previous cardioversions: none Previous ablations: none CHADS2VASC score: 2 Anticoagulation history: none   Past Medical History:  Diagnosis Date  . Complete heart block (HCC) 02/04/2020  . Hyperlipidemia   . Hypertension   . 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 PACEMAKER IMPLANT N/A 02/04/2020   Procedure: PACEMAKER IMPLANT;  Surgeon: [name], MD;  [facility] CV LAB;   Service: Cardiovascular;  Laterality: N/A;  . TEMPORARY PACEMAKER N/A 02/03/2020   Procedure: TEMPORARY PACEMAKER;  Surgeon: Marcina Millard, MD;  Location: [facility] INVASIVE CV LAB;  Service: Cardiovascular;  Laterality: N/A;    Current Outpatient Medications  Medication Sig Dispense Refill  . amLODipine (NORVASC) 2.5 MG tablet Take 2.5 mg by mouth daily.    . fenofibrate 160 MG tablet Take 160 mg by mouth daily.    [name] losartan (COZAAR) 100 MG tablet Take 100 mg by mouth daily.    [name] apixaban (ELIQUIS) 5 MG TABS tablet Take 1 tablet (5 mg total) by mouth 2 (two) times daily. 60 tablet 3   No current facility-administered medications for this encounter.    Allergies  Allergen Reactions  . Lidocaine     Chills and generalized diaphoresis     Social History   Socioeconomic History  . Marital status: Married    Spouse name: Not on file  . Number of children: Not on file  . Years of education: Not on file  . Highest education level: Not on file  Occupational History  . Not on file  Tobacco Use  . Smoking status: Former Smoker    Types: Cigarettes    Quit date: 1985    Years since quitting: 36.8  . Smokeless tobacco: Never Used  Substance and Sexual Activity  . Alcohol use: Never  . Drug use: Never  . Sexual activity: Not on file  Other Topics Concern  . Not on file  Social History Narrative  . Not on file   Social Determinants of Health   Financial Resource Strain:   . Difficulty  of Paying Living Expenses: Not on file  Food Insecurity:   . Worried About Programme researcher, broadcasting/film/video in the Last Year: Not on file  . Ran Out of Food in the Last Year: Not on file  Transportation Needs:   . Lack of Transportation (Medical): Not on file  . Lack of Transportation (Non-Medical): Not on file  Physical Activity:   . Days of Exercise per Week: Not on file  . Minutes of Exercise per Session: Not on file  Stress:   . Feeling of Stress : Not on file  Social Connections:   . Frequency  of Communication with Friends and Family: Not on file  . Frequency of Social Gatherings with Friends and Family: Not on file  . Attends Religious Services: Not on file  . Active Member of Clubs or Organizations: Not on file  . Attends Banker Meetings: Not on file  . Marital Status: Not on file  Intimate Partner Violence:   . Fear of Current or Ex-Partner: Not on file  . Emotionally Abused: Not on file  . Physically Abused: Not on file  . Sexually Abused: Not on file     ROS- All systems are reviewed and negative except as per the HPI above.  Physical Exam: Vitals:   09/06/20 0826  BP: (!) 168/84  Pulse: 70  Weight: 131.6 kg  Height: 6' (1.829 m)    GEN- The patient is well appearing obese male, alert and oriented x 3 today.   Head- normocephalic, atraumatic Eyes-  Sclera clear, conjunctiva pink Ears- hearing intact Oropharynx- clear Neck- supple  Lungs- Clear to ausculation bilaterally, normal work of breathing Heart- Regular rate and rhythm, no murmurs, rubs or gallops  GI- soft, NT, ND, + BS Extremities- no clubbing, cyanosis, or edema MS- no significant deformity or atrophy Skin- no rash or lesion Psych- euthymic mood, full affect Neuro- strength and sensation are intact  Wt Readings from Last 3 Encounters:  09/06/20 131.6 kg  05/09/20 126 kg  02/04/20 115.7 kg    EKG today demonstrates V paced rhythm with likely underlying afib HR 70, QRS 170, QTc 520  Echo 02/04/20 demonstrated  1. Left ventricular ejection fraction, by estimation, is 50 to 55%. The  left ventricle has low normal function. The left ventricle has no regional  wall motion abnormalities. Left ventricular diastolic parameters are  indeterminate.  2. Right ventricular systolic function is normal. The right ventricular  size is normal. There is mildly elevated pulmonary artery systolic  pressure.  3. The mitral valve is normal in structure. Trivial mitral valve  regurgitation.   4. The aortic valve is tricuspid. Aortic valve regurgitation is not  visualized. No aortic stenosis is present.   Epic records are reviewed at length today  CHA2DS2-VASc Score = 2  The patient's score is based upon: CHF History: 0 HTN History: 1 Diabetes History: 0 Stroke History: 0 Vascular Disease History: 0 Age Score: 1 Gender Score: 0      ASSESSMENT AND PLAN: 1. Paroxysmal Atrial Fibrillation (ICD10:  I48.0) The patient's CHA2DS2-VASc score is 2, indicating a 2.2% annual risk of stroke.   General education about afib provided and questions answered. We also discussed his stroke risk and the risks and benefits of anticoagulation. Will plan to start Eliquis 5 mg BID Check bmet/CBC today. Will have patient send manual transmission prior to follow up. if he remains persistent, will consider DCCV.   2. Secondary Hypercoagulable State (ICD10:  D68.69) The patient  is at significant risk for stroke/thromboembolism based upon his CHA2DS2-VASc Score of 2.  Start Apixaban (Eliquis).   3. Obesity Body mass index is 39.36 kg/m. Lifestyle modification was discussed at length including regular exercise and weight reduction.  4. HTN Elevated today, he has not taken his AM medications yet. Well controlled at home. No changes today.  5. CHB S/p PPM, followed by Dr Johney Frame and the device clinic.   Follow up in the AF clinic in 3 weeks.    Jorja Loa PA-C Afib Clinic [facility] [address] [place], [place] [address] [phone] 09/06/2020 9:05 AM

## 2020-09-06 ENCOUNTER — Other Ambulatory Visit: Payer: Self-pay

## 2020-09-06 ENCOUNTER — Ambulatory Visit (HOSPITAL_COMMUNITY)
Admission: RE | Admit: 2020-09-06 | Discharge: 2020-09-06 | Disposition: A | Discharge status: Home / Self Care | Payer: Medicare Other | Source: Ambulatory Visit | Attending: Physician Assistant | Admitting: Physician Assistant

## 2020-09-06 VITALS — BP 168/84 | HR 70 | Ht 72.0 in | Wt 290.2 lb

## 2020-09-06 DIAGNOSIS — I480 Paroxysmal atrial fibrillation: Secondary | ICD-10-CM | POA: Insufficient documentation

## 2020-09-06 DIAGNOSIS — D6869 Other thrombophilia: Secondary | ICD-10-CM | POA: Diagnosis not present

## 2020-09-06 DIAGNOSIS — E785 Hyperlipidemia, unspecified: Secondary | ICD-10-CM | POA: Diagnosis not present

## 2020-09-06 DIAGNOSIS — Z6839 Body mass index (BMI) 39.0-39.9, adult: Secondary | ICD-10-CM | POA: Insufficient documentation

## 2020-09-06 DIAGNOSIS — Z87891 Personal history of nicotine dependence: Secondary | ICD-10-CM | POA: Insufficient documentation

## 2020-09-06 DIAGNOSIS — R9431 Abnormal electrocardiogram [ECG] [EKG]: Secondary | ICD-10-CM | POA: Diagnosis not present

## 2020-09-06 DIAGNOSIS — Z79899 Other long term (current) drug therapy: Secondary | ICD-10-CM | POA: Insufficient documentation

## 2020-09-06 DIAGNOSIS — Z7901 Long term (current) use of anticoagulants: Secondary | ICD-10-CM | POA: Diagnosis not present

## 2020-09-06 DIAGNOSIS — I10 Essential (primary) hypertension: Secondary | ICD-10-CM | POA: Insufficient documentation

## 2020-09-06 DIAGNOSIS — E669 Obesity, unspecified: Secondary | ICD-10-CM | POA: Diagnosis not present

## 2020-09-06 DIAGNOSIS — Z950 Presence of cardiac pacemaker: Secondary | ICD-10-CM | POA: Insufficient documentation

## 2020-09-06 LAB — CBC
HCT: 42.8 % (ref 39.0–52.0)
Hemoglobin: 13.4 g/dL (ref 13.0–17.0)
MCH: 29.7 pg (ref 26.0–34.0)
MCHC: 31.3 g/dL (ref 30.0–36.0)
MCV: 94.9 fL (ref 80.0–100.0)
Platelets: 218 10*3/uL (ref 150–400)
RBC: 4.51 MIL/uL (ref 4.22–5.81)
RDW: 13.4 % (ref 11.5–15.5)
WBC: 7 10*3/uL (ref 4.0–10.5)
nRBC: 0 % (ref 0.0–0.2)

## 2020-09-06 LAB — BASIC METABOLIC PANEL
Anion gap: 7 (ref 5–15)
BUN: 22 mg/dL (ref 8–23)
CO2: 25 mmol/L (ref 22–32)
Calcium: 9.8 mg/dL (ref 8.9–10.3)
Chloride: 111 mmol/L (ref 98–111)
Creatinine, Ser: 1.14 mg/dL (ref 0.61–1.24)
GFR, Estimated: >60 mL/min (ref >60)
Glucose, Bld: 127 mg/dL — ABNORMAL HIGH (ref 70–99)
Potassium: 4.2 mmol/L (ref 3.5–5.1)
Sodium: 143 mmol/L (ref 135–145)

## 2020-09-06 MED ORDER — APIXABAN 5 MG PO TABS: 5.0000 mg | ORAL_TABLET | Freq: Two times a day (BID) | ORAL | 3 refills | Status: DC

## 2020-09-06 NOTE — Patient Instructions (Signed)
Start Eliquis 5mg  twice a day   Send transmission 2 days prior to upcoming appointment.

## 2020-09-27 ENCOUNTER — Other Ambulatory Visit: Payer: Self-pay

## 2020-09-27 ENCOUNTER — Ambulatory Visit (HOSPITAL_COMMUNITY)
Admission: RE | Admit: 2020-09-27 | Discharge: 2020-09-27 | Disposition: A | Discharge status: Home / Self Care | Payer: Medicare Other | Source: Ambulatory Visit | Attending: Physician Assistant | Admitting: Physician Assistant

## 2020-09-27 ENCOUNTER — Encounter (HOSPITAL_COMMUNITY): Payer: Self-pay | Admitting: Physician Assistant

## 2020-09-27 VITALS — BP 160/82 | HR 71 | Ht 72.0 in | Wt 279.6 lb

## 2020-09-27 DIAGNOSIS — Z7901 Long term (current) use of anticoagulants: Secondary | ICD-10-CM | POA: Diagnosis not present

## 2020-09-27 DIAGNOSIS — E669 Obesity, unspecified: Secondary | ICD-10-CM | POA: Insufficient documentation

## 2020-09-27 DIAGNOSIS — I10 Essential (primary) hypertension: Secondary | ICD-10-CM | POA: Insufficient documentation

## 2020-09-27 DIAGNOSIS — Z87891 Personal history of nicotine dependence: Secondary | ICD-10-CM | POA: Diagnosis not present

## 2020-09-27 DIAGNOSIS — Z950 Presence of cardiac pacemaker: Secondary | ICD-10-CM | POA: Diagnosis not present

## 2020-09-27 DIAGNOSIS — I442 Atrioventricular block, complete: Secondary | ICD-10-CM | POA: Insufficient documentation

## 2020-09-27 DIAGNOSIS — Z6837 Body mass index (BMI) 37.0-37.9, adult: Secondary | ICD-10-CM | POA: Diagnosis not present

## 2020-09-27 DIAGNOSIS — I4819 Other persistent atrial fibrillation: Secondary | ICD-10-CM | POA: Insufficient documentation

## 2020-09-27 DIAGNOSIS — D6869 Other thrombophilia: Secondary | ICD-10-CM | POA: Diagnosis not present

## 2020-09-27 DIAGNOSIS — E785 Hyperlipidemia, unspecified: Secondary | ICD-10-CM | POA: Diagnosis not present

## 2020-09-27 DIAGNOSIS — Z79899 Other long term (current) drug therapy: Secondary | ICD-10-CM | POA: Insufficient documentation

## 2020-09-27 LAB — BASIC METABOLIC PANEL
Anion gap: 11 (ref 5–15)
BUN: 21 mg/dL (ref 8–23)
CO2: 21 mmol/L — ABNORMAL LOW (ref 22–32)
Calcium: 10.4 mg/dL — ABNORMAL HIGH (ref 8.9–10.3)
Chloride: 109 mmol/L (ref 98–111)
Creatinine, Ser: 1.15 mg/dL (ref 0.61–1.24)
GFR, Estimated: >60 mL/min (ref >60)
Glucose, Bld: 116 mg/dL — ABNORMAL HIGH (ref 70–99)
Potassium: 4.3 mmol/L (ref 3.5–5.1)
Sodium: 141 mmol/L (ref 135–145)

## 2020-09-27 LAB — CBC
HCT: 46 % (ref 39.0–52.0)
Hemoglobin: 14.6 g/dL (ref 13.0–17.0)
MCH: 29.8 pg (ref 26.0–34.0)
MCHC: 31.7 g/dL (ref 30.0–36.0)
MCV: 93.9 fL (ref 80.0–100.0)
Platelets: 256 10*3/uL (ref 150–400)
RBC: 4.9 MIL/uL (ref 4.22–5.81)
RDW: 13.1 % (ref 11.5–15.5)
WBC: 7.2 10*3/uL (ref 4.0–10.5)
nRBC: 0 % (ref 0.0–0.2)

## 2020-09-27 NOTE — H&P (View-Only) (Signed)
° ° °Primary Care Physician: Hedrick, James, MD °Primary Electrophysiologist: Dr Allred °Referring Physician: Device clinic/Andy Tillery ° ° °Bill Young is a 74 y.o. male with a history of CHB s/p PPM, HLD, HTN, and paroxysmal atrial fibrillation who presents for follow up in the [facility]. The patient was initially diagnosed with atrial fibrillation 09/03/20 on a remote device alert. He was asymptomatic. Patient started on Eliquis for a CHADS2VASC score of 2. He denies snoring or alcohol use.  ° °On follow up today, patient reports he has done well since his last visit. He does not appear very aware of his arrhythmia although he does admit he has "slowed down" in recent weeks. Manual device transmission 11/16 showed persistent afib. He denies any bleeding issues on anticoagulation.  ° °Today, he denies symptoms of palpitations, chest pain, shortness of breath, orthopnea, PND, lower extremity edema, dizziness, presyncope, syncope, snoring, daytime somnolence, bleeding, or neurologic sequela. The patient is tolerating medications without difficulties and is otherwise without complaint today.  ° ° °Atrial Fibrillation Risk Factors: ° °he does not have symptoms or diagnosis of sleep apnea. °he does not have a history of rheumatic fever. °he does not have a history of alcohol use. °The patient does not have a history of early familial atrial fibrillation or other arrhythmias. ° °he has a BMI of Body mass index is 37.92 kg/m².. °Filed Weights  ° 09/27/20 1039  °Weight: 126.8 kg  ° ° °No family history on file. 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    °• apixaban (ELIQUIS) 5 MG TABS tablet Take 1 tablet (5 mg total) by mouth 2 (two) times daily. 60 tablet 3  °• Cholecalciferol (VITAMIN D3) 50 MCG (2000 UT) capsule Take 2,000 Units by mouth in the morning and at bedtime.    °• fenofibrate 160 MG tablet Take 160 mg by mouth daily.    °• losartan (COZAAR) 100 MG tablet Take 100 mg by mouth daily.    °• Multiple Vitamins-Minerals (MULTIVITAMIN ADULTS 50+) TABS Take by mouth.    ° °No current facility-administered medications for this encounter.  ° ° °Allergies  °Allergen Reactions  °• Lidocaine   °  Chills and generalized diaphoresis   ° ° °Social History  ° °Socioeconomic History  °• Marital status: Married  °  Spouse name: Not on file  °• Number of children: Not on file  °• Years of education: Not on file  °• Highest education level: Not on file  °Occupational History  °• Not on file  °Tobacco Use  °• Smoking status: Former Smoker  °  Types: Cigarettes  °  Quit date: 1985  °  Years since quitting: 36.9  °•   Smokeless tobacco: Never Used  °Substance and Sexual Activity  °• Alcohol use: Never  °• Drug use: Never  °• Sexual activity: Not on file  °Other Topics Concern  °• Not on file  °Social History Narrative  °• Not on file  ° °Social Determinants of Health  ° °Financial Resource Strain:   °• Difficulty of Paying Living Expenses: Not on file  °Food Insecurity:   °• Worried About Running Out of Food in the Last Year: Not on file  °•  Ran Out of Food in the Last Year: Not on file  °Transportation Needs:   °• Lack of Transportation (Medical): Not on file  °• Lack of Transportation (Non-Medical): Not on file  °Physical Activity:   °• Days of Exercise per Week: Not on file  °• Minutes of Exercise per Session: Not on file  °Stress:   °• Feeling of Stress : Not on file  °Social Connections:   °• Frequency of Communication with Friends and Family: Not on file  °• Frequency of Social Gatherings with Friends and Family: Not on file  °• Attends Religious Services: Not on file  °• Active Member of Clubs or Organizations: Not on file  °• Attends Club or Organization Meetings: Not on file  °• Marital Status: Not on file  °Intimate Partner Violence:   °• Fear of Current or Ex-Partner: Not on file  °• Emotionally Abused: Not on file  °• Physically Abused: Not on file  °• Sexually Abused: Not on file  ° ° ° °ROS- All systems are reviewed and negative except as per the HPI above. ° °Physical Exam: °Vitals:  ° 09/27/20 1039  °BP: (!) 160/82  °Pulse: 71  °Weight: 126.8 kg  °Height: 6' (1.829 m)  ° ° °GEN- The patient is well appearing obese elderly male, alert and oriented x 3 today.   °HEENT-head normocephalic, atraumatic, sclera clear, conjunctiva pink, hearing intact, trachea midline. °Lungs- Clear to ausculation bilaterally, normal work of breathing °Heart- Regular rate and rhythm, no murmurs, rubs or gallops  °GI- soft, NT, ND, + BS °Extremities- no clubbing, cyanosis, or edema °MS- no significant deformity or atrophy °Skin- no rash or lesion °Psych- euthymic mood, full affect °Neuro- strength and sensation are intact ° ° °Wt Readings from Last 3 Encounters:  °09/27/20 126.8 kg  °09/06/20 131.6 kg  °05/09/20 126 kg  ° ° °EKG today demonstrates V paced with underlying afib HR 71, QRS 158, QTc 502 ° °Echo 02/04/20 demonstrated  ° 1. Left ventricular ejection fraction, by estimation, is 50 to 55%. The  °left ventricle has low normal function. The left ventricle  has no regional  °wall motion abnormalities. Left ventricular diastolic parameters are  °indeterminate.  ° 2. Right ventricular systolic function is normal. The right ventricular  °size is normal. There is mildly elevated pulmonary artery systolic  °pressure.  ° 3. The mitral valve is normal in structure. Trivial mitral valve  °regurgitation.  ° 4. The aortic valve is tricuspid. Aortic valve regurgitation is not  °visualized. No aortic stenosis is present.  ° °Epic records are reviewed at length today ° °CHA2DS2-VASc Score = 2  °The patient's score is based upon: °CHF History: 0 °HTN History: 1 °Diabetes History: 0 °Stroke History: 0 °Vascular Disease History: 0 °Age Score: 1 °Gender Score: 0 °   ° ° °ASSESSMENT AND PLAN: °1. Persistent Atrial Fibrillation °The patient's CHA2DS2-VASc score is 2, indicating a 2.2% annual risk of stroke.   °Patient remains in persistent afib. °Will plan for DCCV now   that he has been on anticoagulation 3 weeks. Check bmet/CBC °Continue Eliquis 5 mg BID. Patient denies any missed doses.   °Patient reports Eliquis is very expensive for him. Will work with him regarding drug coverage. Ultimately may need to change to warfarin.  ° °2. Secondary Hypercoagulable State (ICD10:  D68.69) °The patient is at significant risk for stroke/thromboembolism based upon his CHA2DS2-VASc Score of 2.  Continue Apixaban (Eliquis).  ° °3. Obesity °Body mass index is 37.92 kg/m². °Lifestyle modification was discussed and encouraged including regular physical activity and weight reduction. ° °4. HTN °Stable, no changes today. ° °5. CHB °S/p PPM, followed by Dr Allred and the device clinic. ° ° °Follow up in the AF clinic one week post DCCV.  ° ° °Ricky Peightyn Roberson PA-C °Afib Clinic °[facility] °[address] °[place], [place] [address] °[phone] °09/27/2020 °10:47 AM °

## 2020-09-27 NOTE — Patient Instructions (Signed)
Cardioversion scheduled for Tuesday, November 30th  - Arrive at the [facility] and go to admitting at 1030AM  - Do not eat or drink anything after midnight the night prior to your procedure.  - Take all your morning medication (except diabetic medications) with a sip of water prior to arrival.  - You will not be able to drive home after your procedure.  - Do NOT miss any doses of your blood thinner - if you should miss a dose please notify our office immediately.  - If you feel as if you go back into normal rhythm prior to scheduled cardioversion, please notify our office immediately. If your procedure is canceled in the cardioversion suite you will be charged a cancellation fee.

## 2020-09-27 NOTE — Progress Notes (Signed)
Primary Care Physician: Jerl Mina, MD Primary Electrophysiologist: Dr Johney Frame Referring Physician: Device clinic/Andy Lanna Poche   Bill Young is a 74 y.o. male with a history of CHB s/p PPM, HLD, HTN, and paroxysmal atrial fibrillation who presents for follow up in the [facility]. The patient was initially diagnosed with atrial fibrillation 09/03/20 on a remote device alert. He was asymptomatic. Patient started on Eliquis for a CHADS2VASC score of 2. He denies snoring or alcohol use.   On follow up today, patient reports he has done well since his last visit. He does not appear very aware of his arrhythmia although he does admit he has "slowed down" in recent weeks. Manual device transmission 11/16 showed persistent afib. He denies any bleeding issues on anticoagulation.   Today, he denies symptoms of palpitations, chest pain, shortness of breath, orthopnea, PND, lower extremity edema, dizziness, presyncope, syncope, snoring, daytime somnolence, bleeding, or neurologic sequela. The patient is tolerating medications without difficulties and is otherwise without complaint today.    Atrial Fibrillation Risk Factors:  he does not have symptoms or diagnosis of sleep apnea. he does not have a history of rheumatic fever. he does not have a history of alcohol use. The patient does not have a history of early familial atrial fibrillation or other arrhythmias.  he has a BMI of Body mass index is 37.92 kg/m.[name] Filed Weights   09/27/20 1039  Weight: 126.8 kg    No family history on file.   Atrial Fibrillation Management history:  Previous antiarrhythmic drugs: none Previous cardioversions: none Previous ablations: none CHADS2VASC score: 2 Anticoagulation history: Eliquis   Past Medical History:  Diagnosis Date   Complete heart block (HCC) 02/04/2020   Hyperlipidemia    Hypertension    Third degree heart block (HCC) 02/03/2020   Past  Surgical History:  Procedure Laterality Date   PACEMAKER IMPLANT N/A 02/04/2020   Procedure: PACEMAKER IMPLANT;  Surgeon: [name], MD;  Location: [facility] INVASIVE CV LAB;  Service: Cardiovascular;  Laterality: N/A;   TEMPORARY PACEMAKER N/A 02/03/2020   Procedure: TEMPORARY PACEMAKER;  Surgeon: Marcina Millard, MD;  Location: [facility] INVASIVE CV LAB;  Service: Cardiovascular;  Laterality: N/A;    Current Outpatient Medications  Medication Sig Dispense Refill   amLODipine (NORVASC) 2.5 MG tablet Take 2.5 mg by mouth daily.     apixaban (ELIQUIS) 5 MG TABS tablet Take 1 tablet (5 mg total) by mouth 2 (two) times daily. 60 tablet 3   Cholecalciferol (VITAMIN D3) 50 MCG (2000 UT) capsule Take 2,000 Units by mouth in the morning and at bedtime.     fenofibrate 160 MG tablet Take 160 mg by mouth daily.     losartan (COZAAR) 100 MG tablet Take 100 mg by mouth daily.     Multiple Vitamins-Minerals (MULTIVITAMIN ADULTS 50+) TABS Take by mouth.     No current facility-administered medications for this encounter.    Allergies  Allergen Reactions   Lidocaine     Chills and generalized diaphoresis     Social History   Socioeconomic History   Marital status: Married    Spouse name: Not on file   Number of children: Not on file   Years of education: Not on file   Highest education level: Not on file  Occupational History   Not on file  Tobacco Use   Smoking status: Former Smoker    Types: Cigarettes    Quit date: 1985    Years since quitting: 45.9  Smokeless tobacco: Never Used  Substance and Sexual Activity   Alcohol use: Never   Drug use: Never   Sexual activity: Not on file  Other Topics Concern   Not on file  Social History Narrative   Not on file   Social Determinants of Health   Financial Resource Strain:    Difficulty of Paying Living Expenses: Not on file  Food Insecurity:    Worried About Running Out of Food in the Last Year: Not on file    Ran Out of Food in the Last Year: Not on file  Transportation Needs:    Lack of Transportation (Medical): Not on file   Lack of Transportation (Non-Medical): Not on file  Physical Activity:    Days of Exercise per Week: Not on file   Minutes of Exercise per Session: Not on file  Stress:    Feeling of Stress : Not on file  Social Connections:    Frequency of Communication with Friends and Family: Not on file   Frequency of Social Gatherings with Friends and Family: Not on file   Attends Religious Services: Not on file   Active Member of Clubs or Organizations: Not on file   Attends Banker Meetings: Not on file   Marital Status: Not on file  Intimate Partner Violence:    Fear of Current or Ex-Partner: Not on file   Emotionally Abused: Not on file   Physically Abused: Not on file   Sexually Abused: Not on file     ROS- All systems are reviewed and negative except as per the HPI above.  Physical Exam: Vitals:   09/27/20 1039  BP: (!) 160/82  Pulse: 71  Weight: 126.8 kg  Height: 6' (1.829 m)    GEN- The patient is well appearing obese elderly male, alert and oriented x 3 today.   HEENT-head normocephalic, atraumatic, sclera clear, conjunctiva pink, hearing intact, trachea midline. Lungs- Clear to ausculation bilaterally, normal work of breathing Heart- Regular rate and rhythm, no murmurs, rubs or gallops  GI- soft, NT, ND, + BS Extremities- no clubbing, cyanosis, or edema MS- no significant deformity or atrophy Skin- no rash or lesion Psych- euthymic mood, full affect Neuro- strength and sensation are intact   Wt Readings from Last 3 Encounters:  09/27/20 126.8 kg  09/06/20 131.6 kg  05/09/20 126 kg    EKG today demonstrates V paced with underlying afib HR 71, QRS 158, QTc 502  Echo 02/04/20 demonstrated  1. Left ventricular ejection fraction, by estimation, is 50 to 55%. The  left ventricle has low normal function. The left ventricle  has no regional  wall motion abnormalities. Left ventricular diastolic parameters are  indeterminate.  2. Right ventricular systolic function is normal. The right ventricular  size is normal. There is mildly elevated pulmonary artery systolic  pressure.  3. The mitral valve is normal in structure. Trivial mitral valve  regurgitation.  4. The aortic valve is tricuspid. Aortic valve regurgitation is not  visualized. No aortic stenosis is present.   Epic records are reviewed at length today  CHA2DS2-VASc Score = 2  The patient's score is based upon: CHF History: 0 HTN History: 1 Diabetes History: 0 Stroke History: 0 Vascular Disease History: 0 Age Score: 1 Gender Score: 0      ASSESSMENT AND PLAN: 1. Persistent Atrial Fibrillation The patient's CHA2DS2-VASc score is 2, indicating a 2.2% annual risk of stroke.   Patient remains in persistent afib. Will plan for DCCV now  that he has been on anticoagulation 3 weeks. Check bmet/CBC Continue Eliquis 5 mg BID. Patient denies any missed doses.   Patient reports Eliquis is very expensive for him. Will work with him regarding drug coverage. Ultimately may need to change to warfarin.   2. Secondary Hypercoagulable State (ICD10:  D68.69) The patient is at significant risk for stroke/thromboembolism based upon his CHA2DS2-VASc Score of 2.  Continue Apixaban (Eliquis).   3. Obesity Body mass index is 37.92 kg/m. Lifestyle modification was discussed and encouraged including regular physical activity and weight reduction.  4. HTN Stable, no changes today.  5. CHB S/p PPM, followed by Dr Johney Frame and the device clinic.   Follow up in the AF clinic one week post DCCV.    Jorja Loa PA-C [facility] [address] [place], [place] [address] [phone] 09/27/2020 10:47 AM

## 2020-10-05 ENCOUNTER — Other Ambulatory Visit: Payer: Self-pay

## 2020-10-05 ENCOUNTER — Other Ambulatory Visit
Admission: RE | Admit: 2020-10-05 | Discharge: 2020-10-05 | Disposition: A | Discharge status: Home / Self Care | Payer: Medicare Other | Source: Ambulatory Visit | Attending: Cardiovascular Disease | Admitting: Cardiovascular Disease

## 2020-10-05 ENCOUNTER — Inpatient Hospital Stay: Admission: RE | Admit: 2020-10-05 | Payer: Medicare Other | Source: Ambulatory Visit

## 2020-10-05 DIAGNOSIS — Z20822 Contact with and (suspected) exposure to covid-19: Secondary | ICD-10-CM | POA: Insufficient documentation

## 2020-10-05 DIAGNOSIS — Z01812 Encounter for preprocedural laboratory examination: Secondary | ICD-10-CM | POA: Diagnosis present

## 2020-10-06 LAB — SARS CORONAVIRUS 2 (TAT 6-24 HRS): SARS Coronavirus 2: NEGATIVE

## 2020-10-09 ENCOUNTER — Ambulatory Visit (HOSPITAL_COMMUNITY): Payer: Medicare Other | Admitting: Anesthesiology

## 2020-10-09 ENCOUNTER — Encounter (HOSPITAL_COMMUNITY): Payer: Self-pay | Admitting: Cardiovascular Disease

## 2020-10-09 ENCOUNTER — Encounter (HOSPITAL_COMMUNITY)
Admission: RE | Disposition: A | Discharge status: Home / Self Care | Payer: Self-pay | Source: Home / Self Care | Attending: Cardiovascular Disease

## 2020-10-09 ENCOUNTER — Ambulatory Visit (HOSPITAL_COMMUNITY)
Admission: RE | Admit: 2020-10-09 | Discharge: 2020-10-09 | Disposition: A | Discharge status: Home / Self Care | Payer: Medicare Other | Attending: Cardiovascular Disease | Admitting: Cardiovascular Disease

## 2020-10-09 DIAGNOSIS — Z6837 Body mass index (BMI) 37.0-37.9, adult: Secondary | ICD-10-CM | POA: Diagnosis not present

## 2020-10-09 DIAGNOSIS — E669 Obesity, unspecified: Secondary | ICD-10-CM | POA: Diagnosis not present

## 2020-10-09 DIAGNOSIS — I442 Atrioventricular block, complete: Secondary | ICD-10-CM | POA: Diagnosis not present

## 2020-10-09 DIAGNOSIS — I10 Essential (primary) hypertension: Secondary | ICD-10-CM | POA: Diagnosis not present

## 2020-10-09 DIAGNOSIS — E785 Hyperlipidemia, unspecified: Secondary | ICD-10-CM | POA: Diagnosis not present

## 2020-10-09 DIAGNOSIS — Z79899 Other long term (current) drug therapy: Secondary | ICD-10-CM | POA: Diagnosis not present

## 2020-10-09 DIAGNOSIS — Z87891 Personal history of nicotine dependence: Secondary | ICD-10-CM | POA: Insufficient documentation

## 2020-10-09 DIAGNOSIS — Z7901 Long term (current) use of anticoagulants: Secondary | ICD-10-CM | POA: Insufficient documentation

## 2020-10-09 DIAGNOSIS — I4819 Other persistent atrial fibrillation: Secondary | ICD-10-CM | POA: Insufficient documentation

## 2020-10-09 DIAGNOSIS — D6869 Other thrombophilia: Secondary | ICD-10-CM | POA: Diagnosis not present

## 2020-10-09 HISTORY — PX: CARDIOVERSION: SHX1299

## 2020-10-09 SURGERY — CARDIOVERSION
Anesthesia: General

## 2020-10-09 MED ORDER — LIDOCAINE 2% (20 MG/ML) 5 ML SYRINGE
INTRAMUSCULAR | Status: DC | PRN
  Administered 2020-10-09: 40 mg via INTRAVENOUS

## 2020-10-09 MED ORDER — PROPOFOL 10 MG/ML IV BOLUS
INTRAVENOUS | Status: DC | PRN
  Administered 2020-10-09: 100 mg via INTRAVENOUS

## 2020-10-09 NOTE — Transfer of Care (Signed)
Immediate Anesthesia Transfer of Care Note  Patient: Bill Young  Procedure(s) Performed: CARDIOVERSION (N/A )  Patient Location: Endoscopy Unit  Anesthesia Type:General  Level of Consciousness: awake, alert  and oriented  Airway & Oxygen Therapy: Patient Spontanous Breathing  Post-op Assessment: Report given to RN and Post -op Vital signs reviewed and stable  Post vital signs: Reviewed and stable  Last Vitals:  Vitals Value Taken Time  BP    Temp    Pulse    Resp    SpO2      Last Pain:  Vitals:   10/09/20 0950  TempSrc: Temporal  PainSc: 0-No pain         Complications: No complications documented.

## 2020-10-09 NOTE — Interval H&P Note (Signed)
History and Physical Interval Note:  10/09/2020 11:29 AM  Bill Young  has presented today for surgery, with the diagnosis of AFIB.  The various methods of treatment have been discussed with the patient and family. After consideration of risks, benefits and other options for treatment, the patient has consented to  Procedure(s): CARDIOVERSION (N/A) as a surgical intervention.  The patient's history has been reviewed, patient examined, no change in status, stable for surgery.  I have reviewed the patient's chart and labs.  Questions were answered to the patient's satisfaction.     Chilton Si, MD

## 2020-10-09 NOTE — CV Procedure (Signed)
Electrical Cardioversion Procedure Note Bill Young [id number] 09-29-1946  Procedure: Electrical Cardioversion Indications:  Atrial Fibrillation  Procedure Details Consent: Risks of procedure as well as the alternatives and risks of each were explained to the (patient/caregiver).  Consent for procedure obtained. Time Out: Verified patient identification, verified procedure, site/side was marked, verified correct patient position, special equipment/implants available, medications/allergies/relevent history reviewed, required imaging and test results available.  Performed  Patient placed on cardiac monitor, pulse oximetry, supplemental oxygen as necessary.  Sedation given: propofol Pacer pads placed anterior and posterior chest.  Cardioverted 1 time(s).  Cardioverted at 150J.  Evaluation Findings: Post procedure EKG shows: AS VP Complications: None Patient did tolerate procedure well.   Chilton Si, MD 10/09/2020, 11:50 AM

## 2020-10-09 NOTE — Discharge Instructions (Signed)
Electrical Cardioversion Electrical cardioversion is the delivery of a jolt of electricity to restore a normal rhythm to the heart. A rhythm that is too fast or is not regular keeps the heart from pumping well. In this procedure, sticky patches or metal paddles are placed on the chest to deliver electricity to the heart from a device. This procedure may be done in an emergency if:  There is low or no blood pressure as a result of the heart rhythm.  Normal rhythm must be restored as fast as possible to protect the brain and heart from further damage.  It may save a life. This may also be a scheduled procedure for irregular or fast heart rhythms that are not immediately life-threatening. Tell a health care provider about:  Any allergies you have.  All medicines you are taking, including vitamins, herbs, eye drops, creams, and over-the-counter medicines.  Any problems you or family members have had with anesthetic medicines.  Any blood disorders you have.  Any surgeries you have had.  Any medical conditions you have.  Whether you are pregnant or may be pregnant. What are the risks? Generally, this is a safe procedure. However, problems may occur, including:  Allergic reactions to medicines.  A blood clot that breaks free and travels to other parts of your body.  The possible return of an abnormal heart rhythm within hours or days after the procedure.  Your heart stopping (cardiac arrest). This is rare. What happens before the procedure? Medicines  Your health care provider may have you start taking: ? Blood-thinning medicines (anticoagulants) so your blood does not clot as easily. ? Medicines to help stabilize your heart rate and rhythm.  Ask your health care provider about: ? Changing or stopping your regular medicines. This is especially important if you are taking diabetes medicines or blood thinners. ? Taking medicines such as aspirin and ibuprofen. These medicines can  thin your blood. Do not take these medicines unless your health care provider tells you to take them. ? Taking over-the-counter medicines, vitamins, herbs, and supplements. General instructions  Follow instructions from your health care provider about eating or drinking restrictions.  Plan to have someone take you home from the hospital or clinic.  If you will be going home right after the procedure, plan to have someone with you for 24 hours.  Ask your health care provider what steps will be taken to help prevent infection. These may include washing your skin with a germ-killing soap. What happens during the procedure?   An IV will be inserted into one of your veins.  Sticky patches (electrodes) or metal paddles may be placed on your chest.  You will be given a medicine to help you relax (sedative).  An electrical shock will be delivered. The procedure may vary among health care providers and hospitals. What can I expect after the procedure?  Your blood pressure, heart rate, breathing rate, and blood oxygen level will be monitored until you leave the hospital or clinic.  Your heart rhythm will be watched to make sure it does not change.  You may have some redness on the skin where the shocks were given. Follow these instructions at home:  Do not drive for 24 hours if you were given a sedative during your procedure.  Take over-the-counter and prescription medicines only as told by your health care provider.  Ask your health care provider how to check your pulse. Check it often.  Rest for 48 hours after the procedure or   as told by your health care provider.  Avoid or limit your caffeine use as told by your health care provider.  Keep all follow-up visits as told by your health care provider. This is important. Contact a health care provider if:  You feel like your heart is beating too quickly or your pulse is not regular.  You have a serious muscle cramp that does not go  away. Get help right away if:  You have discomfort in your chest.  You are dizzy or you feel faint.  You have trouble breathing or you are short of breath.  Your speech is slurred.  You have trouble moving an arm or leg on one side of your body.  Your fingers or toes turn cold or blue. Summary  Electrical cardioversion is the delivery of a jolt of electricity to restore a normal rhythm to the heart.  This procedure may be done right away in an emergency or may be a scheduled procedure if the condition is not an emergency.  Generally, this is a safe procedure.  After the procedure, check your pulse often as told by your health care provider. This information is not intended to replace advice given to you by your health care provider. Make sure you discuss any questions you have with your health care provider. Document Revised: 05/30/2019 Document Reviewed: 05/30/2019 Elsevier Patient Education  2020 [employer]  

## 2020-10-09 NOTE — Anesthesia Postprocedure Evaluation (Signed)
Anesthesia Post Note  Patient: Bill Young  Procedure(s) Performed: CARDIOVERSION (N/A )     Patient location during evaluation: Endoscopy Anesthesia Type: General Level of consciousness: awake and alert Pain management: pain level controlled Vital Signs Assessment: post-procedure vital signs reviewed and stable Respiratory status: spontaneous breathing, nonlabored ventilation, respiratory function stable and patient connected to nasal cannula oxygen Cardiovascular status: blood pressure returned to baseline and stable Postop Assessment: no apparent nausea or vomiting Anesthetic complications: no   No complications documented.  Last Vitals:  Vitals:   10/09/20 1210 10/09/20 1220  BP: 116/74 121/64  Pulse: 74 77  Resp: 14 11  Temp:    SpO2: 99% 98%    Last Pain:  Vitals:   10/09/20 1210  TempSrc:   PainSc: 0-No pain                 Kemonie Cutillo COKER

## 2020-10-09 NOTE — Anesthesia Procedure Notes (Signed)
Procedure Name: General with mask airway Performed by: Nazaret Chea B, CRNA Pre-anesthesia Checklist: Patient identified, Emergency Drugs available, Suction available, Patient being monitored and Timeout performed Patient Re-evaluated:Patient Re-evaluated prior to induction Oxygen Delivery Method: Ambu bag Preoxygenation: Pre-oxygenation with 100% oxygen Induction Type: IV induction Placement Confirmation: positive ETCO2 Dental Injury: Teeth and Oropharynx as per pre-operative assessment        

## 2020-10-09 NOTE — Anesthesia Preprocedure Evaluation (Signed)
Anesthesia Evaluation   Patient awake    Reviewed: Allergy & Precautions, NPO status , Patient's Chart, lab work & pertinent test results  Airway Mallampati: II  TM Distance: >3 FB Neck ROM: Full    Dental  (+) Teeth Intact, Dental Advisory Given   Pulmonary former smoker,    breath sounds clear to auscultation       Cardiovascular hypertension,  Rhythm:Irregular Rate:Normal     Neuro/Psych    GI/Hepatic   Endo/Other    Renal/GU      Musculoskeletal   Abdominal   Peds  Hematology   Anesthesia Other Findings   Reproductive/Obstetrics                             Anesthesia Physical Anesthesia Plan  ASA: III  Anesthesia Plan: General   Post-op Pain Management:    Induction: Intravenous  PONV Risk Score and Plan:   Airway Management Planned: Mask  Additional Equipment:   Intra-op Plan:   Post-operative Plan:   Informed Consent: I have reviewed the patients History and Physical, chart, labs and discussed the procedure including the risks, benefits and alternatives for the proposed anesthesia with the patient or authorized representative who has indicated his/her understanding and acceptance.       Plan Discussed with: CRNA and Anesthesiologist  Anesthesia Plan Comments:         Anesthesia Quick Evaluation

## 2020-10-15 NOTE — Progress Notes (Signed)
Primary Care Physician: Jerl Mina, MD Primary Electrophysiologist: Dr Johney Frame Referring Physician: Device clinic/Andy Lanna Poche   Bill Young is a 74 y.o. male with a history of CHB s/p PPM, HLD, HTN, and paroxysmal atrial fibrillation who presents for follow up in the [facility]. The patient was initially diagnosed with atrial fibrillation 09/03/20 on a remote device alert. He was asymptomatic. Patient started on Eliquis for a CHADS2VASC score of 2. He denies snoring or alcohol use.   Manual device transmission 11/16 showed persistent afib. He is s/p DCCV on 10/09/20. Patient reports that he felt better the next day with more energy. He denies any bleeding issues on anticoagulation.   Today, he denies symptoms of palpitations, chest pain, shortness of breath, orthopnea, PND, lower extremity edema, dizziness, presyncope, syncope, snoring, daytime somnolence, bleeding, or neurologic sequela. The patient is tolerating medications without difficulties and is otherwise without complaint today.    Atrial Fibrillation Risk Factors:  he does not have symptoms or diagnosis of sleep apnea. he does not have a history of rheumatic fever. he does not have a history of alcohol use. The patient does not have a history of early familial atrial fibrillation or other arrhythmias.  he has a BMI of Body mass index is 37.32 kg/m.[name] Filed Weights   10/16/20 0925  Weight: 124.8 kg    No family history on file.   Atrial Fibrillation Management history:  Previous antiarrhythmic drugs: none Previous cardioversions: 10/09/20 Previous ablations: none CHADS2VASC score: 2 Anticoagulation history: Eliquis   Past Medical History:  Diagnosis Date  . Complete heart block (HCC) 02/04/2020  . Hyperlipidemia   . Hypertension   . 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 CARDIOVERSION N/A 10/09/2020    Procedure: CARDIOVERSION;  Surgeon: Chilton Si, MD;  Location: [facility] ENDOSCOPY;  Service: Cardiovascular;  Laterality: N/A;  . PACEMAKER IMPLANT N/A 02/04/2020   Procedure: PACEMAKER IMPLANT;  Surgeon: [name], MD;  Location: [facility] INVASIVE CV LAB;  Service: Cardiovascular;  Laterality: N/A;  . TEMPORARY PACEMAKER N/A 02/03/2020   Procedure: TEMPORARY PACEMAKER;  Surgeon: Marcina Millard, MD;  Location: [facility] INVASIVE CV LAB;  Service: Cardiovascular;  Laterality: N/A;    Current Outpatient Medications  Medication Sig Dispense Refill  . amLODipine (NORVASC) 2.5 MG tablet Take 2.5 mg by mouth daily.    [name] apixaban (ELIQUIS) 5 MG TABS tablet Take 1 tablet (5 mg total) by mouth 2 (two) times daily. 60 tablet 3  . Ascorbic Acid (VITAMIN C) 1000 MG tablet Take 1,000 mg by mouth daily.    . Cholecalciferol (VITAMIN D3) 50 MCG (2000 UT) capsule Take 2,000 Units by mouth in the morning and at bedtime.    . fenofibrate 160 MG tablet Take 160 mg by mouth at bedtime.     [name] losartan (COZAAR) 100 MG tablet Take 100 mg by mouth daily.    . Multiple Vitamins-Minerals (MULTIVITAMIN ADULTS 50+) TABS Take by mouth.     No current facility-administered medications for this encounter.    Allergies  Allergen Reactions  . Lidocaine Other (See Comments)    Chills and generalized diaphoresis  EPI mixed with lidocaine to stop bleeding    Social History   Socioeconomic History  . Marital status: Married    Spouse name: Not on file  . Number of children: Not on file  . Years of education: Not on file  . Highest education level: Not on  file  Occupational History  . Not on file  Tobacco Use  . Smoking status: Former Smoker    Types: Cigarettes    Quit date: 1985    Years since quitting: 36.9  . Smokeless tobacco: Never Used  Substance and Sexual Activity  . Alcohol use: Never  . Drug use: Never  . Sexual activity: Not on file  Other Topics Concern  . Not on file  Social History Narrative   . Not on file   Social Determinants of Health   Financial Resource Strain:   . Difficulty of Paying Living Expenses: Not on file  Food Insecurity:   . Worried About Programme researcher, broadcasting/film/video in the Last Year: Not on file  . Ran Out of Food in the Last Year: Not on file  Transportation Needs:   . Lack of Transportation (Medical): Not on file  . Lack of Transportation (Non-Medical): Not on file  Physical Activity:   . Days of Exercise per Week: Not on file  . Minutes of Exercise per Session: Not on file  Stress:   . Feeling of Stress : Not on file  Social Connections:   . Frequency of Communication with Friends and Family: Not on file  . Frequency of Social Gatherings with Friends and Family: Not on file  . Attends Religious Services: Not on file  . Active Member of Clubs or Organizations: Not on file  . Attends Banker Meetings: Not on file  . Marital Status: Not on file  Intimate Partner Violence:   . Fear of Current or Ex-Partner: Not on file  . Emotionally Abused: Not on file  . Physically Abused: Not on file  . Sexually Abused: Not on file     ROS- All systems are reviewed and negative except as per the HPI above.  Physical Exam: Vitals:   10/16/20 0925  BP: 140/72  Pulse: 81  Weight: 124.8 kg  Height: 6' (1.829 m)    GEN- The patient is well appearing obese male, alert and oriented x 3 today.   HEENT-head normocephalic, atraumatic, sclera clear, conjunctiva pink, hearing intact, trachea midline. Lungs- Clear to ausculation bilaterally, normal work of breathing Heart- Regular rate and rhythm, no murmurs, rubs or gallops  GI- soft, NT, ND, + BS Extremities- no clubbing, cyanosis, or edema MS- no significant deformity or atrophy Skin- no rash or lesion Psych- euthymic mood, full affect Neuro- strength and sensation are intact   Wt Readings from Last 3 Encounters:  10/16/20 124.8 kg  10/09/20 122.5 kg  09/27/20 126.8 kg    EKG today demonstrates  AS VP rhythm HR 81, PR 224, QRS 160, QTc 499  Echo 02/04/20 demonstrated  1. Left ventricular ejection fraction, by estimation, is 50 to 55%. The  left ventricle has low normal function. The left ventricle has no regional  wall motion abnormalities. Left ventricular diastolic parameters are  indeterminate.  2. Right ventricular systolic function is normal. The right ventricular  size is normal. There is mildly elevated pulmonary artery systolic  pressure.  3. The mitral valve is normal in structure. Trivial mitral valve  regurgitation.  4. The aortic valve is tricuspid. Aortic valve regurgitation is not  visualized. No aortic stenosis is present.   Epic records are reviewed at length today  CHA2DS2-VASc Score = 2  The patient's score is based upon: CHF History: 0 HTN History: 1 Diabetes History: 0 Stroke History: 0 Vascular Disease History: 0 Age Score: 1 Gender Score: 0  ASSESSMENT AND PLAN: 1. Persistent Atrial Fibrillation The patient's CHA2DS2-VASc score is 2, indicating a 2.2% annual risk of stroke.   S/p DCCV on 10/09/20 Patient is back in SR. Continue Eliquis 5 mg BID  2. Secondary Hypercoagulable State (ICD10:  D68.69) The patient is at significant risk for stroke/thromboembolism based upon his CHA2DS2-VASc Score of 2.  Continue Apixaban (Eliquis).   3. Obesity Body mass index is 37.32 kg/m. Lifestyle modification was discussed and encouraged including regular physical activity and weight reduction.  4. HTN Stable, no changes today.  5. CHB S/p PPM, followed by Dr Johney Frame and the device clinic.   Follow up with Otilio Saber per recall.    Jorja Loa PA-C Afib Clinic [facility] [address] [place], [place] [address] [phone] 10/16/2020 9:38 AM

## 2020-10-16 ENCOUNTER — Other Ambulatory Visit: Payer: Self-pay

## 2020-10-16 ENCOUNTER — Encounter (HOSPITAL_COMMUNITY): Payer: Self-pay | Admitting: Physician Assistant

## 2020-10-16 ENCOUNTER — Ambulatory Visit (HOSPITAL_COMMUNITY)
Admission: RE | Admit: 2020-10-16 | Discharge: 2020-10-16 | Disposition: A | Discharge status: Home / Self Care | Payer: Medicare Other | Source: Ambulatory Visit | Attending: Physician Assistant | Admitting: Physician Assistant

## 2020-10-16 VITALS — BP 140/72 | HR 81 | Ht 72.0 in | Wt 275.2 lb

## 2020-10-16 DIAGNOSIS — D6869 Other thrombophilia: Secondary | ICD-10-CM | POA: Insufficient documentation

## 2020-10-16 DIAGNOSIS — E669 Obesity, unspecified: Secondary | ICD-10-CM | POA: Diagnosis not present

## 2020-10-16 DIAGNOSIS — I4819 Other persistent atrial fibrillation: Secondary | ICD-10-CM | POA: Diagnosis present

## 2020-10-16 DIAGNOSIS — Z6837 Body mass index (BMI) 37.0-37.9, adult: Secondary | ICD-10-CM | POA: Diagnosis not present

## 2020-10-16 DIAGNOSIS — Z87891 Personal history of nicotine dependence: Secondary | ICD-10-CM | POA: Diagnosis not present

## 2020-10-16 DIAGNOSIS — Z7901 Long term (current) use of anticoagulants: Secondary | ICD-10-CM | POA: Insufficient documentation

## 2020-10-16 DIAGNOSIS — I10 Essential (primary) hypertension: Secondary | ICD-10-CM | POA: Insufficient documentation

## 2020-10-16 DIAGNOSIS — Z950 Presence of cardiac pacemaker: Secondary | ICD-10-CM | POA: Insufficient documentation

## 2020-11-05 ENCOUNTER — Ambulatory Visit (INDEPENDENT_AMBULATORY_CARE_PROVIDER_SITE_OTHER): Payer: Medicare Other

## 2020-11-05 DIAGNOSIS — I442 Atrioventricular block, complete: Secondary | ICD-10-CM

## 2020-11-05 LAB — CUP PACEART REMOTE DEVICE CHECK
Battery Remaining Longevity: 115 mo
Battery Remaining Percentage: 95.5 %
Battery Voltage: 3.01 V
Brady Statistic AP VP Percent: 49 %
Brady Statistic AP VS Percent: 1 %
Brady Statistic AS VP Percent: 44 %
Brady Statistic AS VS Percent: 1.6 %
Brady Statistic RA Percent Paced: 34 %
Brady Statistic RV Percent Paced: 92 %
Date Time Interrogation Session: 20211227020012
Implantable Lead Implant Date: 20210327
Implantable Lead Implant Date: 20210327
Implantable Lead Location: 753859
Implantable Lead Location: 753860
Implantable Pulse Generator Implant Date: 20210327
Lead Channel Impedance Value: 410 Ohm
Lead Channel Impedance Value: 560 Ohm
Lead Channel Pacing Threshold Amplitude: 0.625 V
Lead Channel Pacing Threshold Amplitude: 0.875 V
Lead Channel Pacing Threshold Pulse Width: 0.5 ms
Lead Channel Pacing Threshold Pulse Width: 0.5 ms
Lead Channel Sensing Intrinsic Amplitude: 1 mV
Lead Channel Sensing Intrinsic Amplitude: 12 mV
Lead Channel Setting Pacing Amplitude: 1.125
Lead Channel Setting Pacing Amplitude: 1.625
Lead Channel Setting Pacing Pulse Width: 0.5 ms
Lead Channel Setting Sensing Sensitivity: 4 mV
Pulse Gen Model: 2272
Pulse Gen Serial Number: 3814642

## 2020-11-19 NOTE — Progress Notes (Signed)
Remote pacemaker transmission.   

## 2021-02-04 ENCOUNTER — Ambulatory Visit (INDEPENDENT_AMBULATORY_CARE_PROVIDER_SITE_OTHER): Payer: Medicare Other

## 2021-02-04 DIAGNOSIS — I442 Atrioventricular block, complete: Secondary | ICD-10-CM | POA: Diagnosis not present

## 2021-02-04 LAB — CUP PACEART REMOTE DEVICE CHECK
Battery Remaining Longevity: 121 mo
Battery Remaining Percentage: 95.5 %
Battery Voltage: 3.01 V
Brady Statistic AP VP Percent: 48 %
Brady Statistic AP VS Percent: 1 %
Brady Statistic AS VP Percent: 45 %
Brady Statistic AS VS Percent: 1.7 %
Brady Statistic RA Percent Paced: 37 %
Brady Statistic RV Percent Paced: 93 %
Date Time Interrogation Session: 20220328020014
Implantable Lead Implant Date: 20210327
Implantable Lead Implant Date: 20210327
Implantable Lead Location: 753859
Implantable Lead Location: 753860
Implantable Pulse Generator Implant Date: 20210327
Lead Channel Impedance Value: 440 Ohm
Lead Channel Impedance Value: 560 Ohm
Lead Channel Pacing Threshold Amplitude: 0.625 V
Lead Channel Pacing Threshold Amplitude: 0.875 V
Lead Channel Pacing Threshold Pulse Width: 0.5 ms
Lead Channel Pacing Threshold Pulse Width: 0.5 ms
Lead Channel Sensing Intrinsic Amplitude: 0.9 mV
Lead Channel Sensing Intrinsic Amplitude: 7.4 mV
Lead Channel Setting Pacing Amplitude: 1.125
Lead Channel Setting Pacing Amplitude: 1.625
Lead Channel Setting Pacing Pulse Width: 0.5 ms
Lead Channel Setting Sensing Sensitivity: 4 mV
Pulse Gen Model: 2272
Pulse Gen Serial Number: 3814642

## 2021-02-07 ENCOUNTER — Other Ambulatory Visit (HOSPITAL_COMMUNITY): Payer: Self-pay | Admitting: Physician Assistant

## 2021-02-15 NOTE — Progress Notes (Signed)
Remote pacemaker transmission.   

## 2021-04-16 NOTE — Progress Notes (Signed)
Electrophysiology Office Note Date: 04/17/2021  ID:  Bill Young, DOB 16-Aug-1946, MRN [id number]  PCP: Jerl Mina, MD Primary Cardiologist: None Electrophysiologist: [name], MD   CC: Pacemaker follow-up  Bill Young is a 75 y.o. male seen today for [name], MD for routine electrophysiology followup.  Since last being seen in our clinic the patient reports doing very well in general. He has severe bilateral arthritis and is working with the [place] to attempt to have them done. Apparently he lives further than 40 miles from the closest [place] so also has the choice of an outside MD if he wishes. He denies chest pain, palpitations, dyspnea, PND, orthopnea, nausea, vomiting, dizziness, syncope, edema, weight gain, or early satiety.  Device History: St. Jude Dual Chamber PPM implanted 01/2020 for symptomatic complete heart block  Past Medical History:  Diagnosis Date  . Complete heart block (HCC) 02/04/2020  . Hyperlipidemia   . Hypertension   . 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 CARDIOVERSION N/A 10/09/2020   Procedure: CARDIOVERSION;  Surgeon: Chilton Si, MD;  Location[facility] ENDOSCOPY;  Service: Cardiovascular;  Laterality: N/A;  . PACEMAKER IMPLANT N/A 02/04/2020   Procedure: PACEMAKER IMPLANT;  Surgeon: [name], MD;  Location: [facility] INVASIVE CV LAB;  Service: Cardiovascular;  Laterality: N/A;  . TEMPORARY PACEMAKER N/A 02/03/2020   Procedure: TEMPORARY PACEMAKER;  Surgeon: Marcina Millard, MD;  Location: [facility] INVASIVE CV LAB;  Service: Cardiovascular;  Laterality: N/A;    Current Outpatient Medications  Medication Sig Dispense Refill  . amLODipine (NORVASC) 2.5 MG tablet Take 2.5 mg by mouth daily.    . Ascorbic Acid (VITAMIN C) 1000 MG tablet Take 1,000 mg by mouth daily.    . Cholecalciferol (VITAMIN D3) 50 MCG (2000 UT) capsule Take 2,000 Units by mouth in the morning and at  bedtime.    [name] ELIQUIS 5 MG TABS tablet Take 1 tablet by mouth twice daily 60 tablet 3  . fenofibrate 160 MG tablet Take 160 mg by mouth at bedtime.     [name] losartan (COZAAR) 100 MG tablet Take 100 mg by mouth daily.    . metFORMIN (GLUCOPHAGE-XR) 500 MG 24 hr tablet Take 500 mg by mouth daily with breakfast.    . Multiple Vitamins-Minerals (MULTIVITAMIN ADULTS 50+) TABS Take by mouth.    . rosuvastatin (CRESTOR) 10 MG tablet Take 1 tablet by mouth daily.     No current facility-administered medications for this visit.    Allergies:   Epinephrine and Lidocaine   Social History: Social History   Socioeconomic History  . Marital status: Married    Spouse name: Not on file  . Number of children: Not on file  . Years of education: Not on file  . Highest education level: Not on file  Occupational History  . Not on file  Tobacco Use  . Smoking status: Former Smoker    Types: Cigarettes    Quit date: 1985    Years since quitting: 37.4  . Smokeless tobacco: Never Used  Substance and Sexual Activity  . Alcohol use: Never  . Drug use: Never  . Sexual activity: Not on file  Other Topics Concern  . Not on file  Social History Narrative  . Not on file   Social Determinants of Health   Financial Resource Strain: Not on file  Food Insecurity: Not on file  Transportation Needs: Not on file  Physical Activity: Not on  file  Stress: Not on file  Social Connections: Not on file  Intimate Partner Violence: Not on file    Family History: No family history on file.   Review of Systems: All other systems reviewed and are otherwise negative except as noted above.  Physical Exam: Vitals:   04/17/21 0928  BP: 130/68  Pulse: 63  SpO2: 97%  Weight: 282 lb (127.9 kg)  Height: 6' (1.829 m)     GEN- The patient is well appearing, alert and oriented x 3 today.   HEENT: normocephalic, atraumatic; sclera clear, conjunctiva pink; hearing intact; oropharynx clear; neck supple  Lungs-  Clear to ausculation bilaterally, normal work of breathing.  No wheezes, rales, rhonchi Heart- Regular rate and rhythm, no murmurs, rubs or gallops  GI- soft, non-tender, non-distended, bowel sounds present  Extremities- no clubbing or cyanosis. No edema MS- no significant deformity or atrophy Skin- warm and dry, no rash or lesion; PPM pocket well healed Psych- euthymic mood, full affect Neuro- strength and sensation are intact  PPM Interrogation- reviewed in detail today,  See PACEART report  EKG:  EKG is not ordered today.   Recent Labs: 09/27/2020: BUN 21; Creatinine, Ser 1.15; Hemoglobin 14.6; Platelets 256; Potassium 4.3; Sodium 141   Wt Readings from Last 3 Encounters:  04/17/21 282 lb (127.9 kg)  10/16/20 275 lb 3.2 oz (124.8 kg)  10/09/20 270 lb (122.5 kg)    Other studies Reviewed: Additional studies/ records that were reviewed today include: Previous EP office notes, Previous remote checks, Most recent labwork.   Assessment and Plan:  1. Symptomatic bradycardia s/p St. Jude PPM  Normal PPM function See Pace Art report No changes today  2. Paroxysmal atrial fibrillation 12% burden by device, though over long follow up time.  He was in AF from Oct-December, but has not had recurrence after [facility]. Continue eliquis for CHA2DS2VASC of at least 4.    3. HTN Continue amlodipine 2.5 mg daily and losartan 100 mg daily.  4. HLD Continue crestor 10 mg daily. Per PCP.  Current medicines are reviewed at length with the patient today.   The patient does not have concerns regarding his medicines.  The following changes were made today:  none  Labs/ tests ordered today include:  No orders of the defined types were placed in this encounter.  Disposition:   Follow up with EP-APP in 6 Months    Signed, Luane School  04/17/2021 9:51 AM  [facility] [address] [place] [address] [phone] (office) [phone] (fax)

## 2021-04-17 ENCOUNTER — Ambulatory Visit (INDEPENDENT_AMBULATORY_CARE_PROVIDER_SITE_OTHER): Payer: Medicare Other | Admitting: Student

## 2021-04-17 ENCOUNTER — Other Ambulatory Visit: Payer: Self-pay

## 2021-04-17 ENCOUNTER — Encounter: Payer: Self-pay | Admitting: Student

## 2021-04-17 VITALS — BP 130/68 | HR 63 | Ht 72.0 in | Wt 282.0 lb

## 2021-04-17 DIAGNOSIS — E782 Mixed hyperlipidemia: Secondary | ICD-10-CM

## 2021-04-17 DIAGNOSIS — I480 Paroxysmal atrial fibrillation: Secondary | ICD-10-CM | POA: Diagnosis not present

## 2021-04-17 DIAGNOSIS — I442 Atrioventricular block, complete: Secondary | ICD-10-CM | POA: Diagnosis not present

## 2021-04-17 DIAGNOSIS — I10 Essential (primary) hypertension: Secondary | ICD-10-CM | POA: Diagnosis not present

## 2021-04-17 LAB — CUP PACEART INCLINIC DEVICE CHECK
Battery Remaining Longevity: 116 mo
Battery Voltage: 3.01 V
Brady Statistic RA Percent Paced: 40 %
Brady Statistic RV Percent Paced: 93 %
Date Time Interrogation Session: 20220608094905
Implantable Lead Implant Date: 20210327
Implantable Lead Implant Date: 20210327
Implantable Lead Location: 753859
Implantable Lead Location: 753860
Implantable Pulse Generator Implant Date: 20210327
Lead Channel Impedance Value: 450 Ohm
Lead Channel Impedance Value: 587.5 Ohm
Lead Channel Pacing Threshold Amplitude: 0.875 V
Lead Channel Pacing Threshold Amplitude: 0.875 V
Lead Channel Pacing Threshold Pulse Width: 0.5 ms
Lead Channel Pacing Threshold Pulse Width: 0.5 ms
Lead Channel Sensing Intrinsic Amplitude: 1.1 mV
Lead Channel Sensing Intrinsic Amplitude: 7.6 mV
Lead Channel Setting Pacing Amplitude: 1.125
Lead Channel Setting Pacing Amplitude: 1.875
Lead Channel Setting Pacing Pulse Width: 0.5 ms
Lead Channel Setting Sensing Sensitivity: 4 mV
Pulse Gen Model: 2272
Pulse Gen Serial Number: 3814642

## 2021-04-17 NOTE — Patient Instructions (Signed)
Medication Instructions:  Your physician recommends that you continue on your current medications as directed. Please refer to the Current Medication list given to you today.  *If you need a refill on your cardiac medications before your next appointment, please call your pharmacy*   Lab Work: None  If you have labs (blood work) drawn today and your tests are completely normal, you will receive your results only by: MyChart Message (if you have MyChart) OR A paper copy in the mail If you have any lab test that is abnormal or we need to change your treatment, we will call you to review the results.   Follow-Up: At [facility], you and your health needs are our priority.  As part of our continuing mission to provide you with exceptional heart care, we have created designated Provider Care Teams.  These Care Teams include your primary Cardiologist (physician) and Advanced Practice Providers (APPs -  Physician Assistants and Nurse Practitioners) who all work together to provide you with the care you need, when you need it.  We recommend signing up for the patient portal called "MyChart".  Sign up information is provided on this After Visit Summary.  MyChart is used to connect with patients for Virtual Visits (Telemedicine).  Patients are able to view lab/test results, encounter notes, upcoming appointments, etc.  Non-urgent messages can be sent to your provider as well.   To learn more about what you can do with MyChart, go to [email or web address].    Your next appointment:   6 month(s)  The format for your next appointment:   In Person  Provider:   Michael "Andy" Tillery, PA-C{    

## 2021-05-06 ENCOUNTER — Other Ambulatory Visit: Payer: Self-pay | Admitting: *Deleted

## 2021-05-06 ENCOUNTER — Ambulatory Visit (INDEPENDENT_AMBULATORY_CARE_PROVIDER_SITE_OTHER): Payer: Medicare Other

## 2021-05-06 DIAGNOSIS — I442 Atrioventricular block, complete: Secondary | ICD-10-CM | POA: Diagnosis not present

## 2021-05-06 MED ORDER — APIXABAN 5 MG PO TABS: 5.0000 mg | ORAL_TABLET | Freq: Two times a day (BID) | ORAL | 5 refills | Status: DC

## 2021-05-06 NOTE — Telephone Encounter (Signed)
Eliquis 5mg  paper refill request received. Patient is 75 years old, weight-127.9kg, Crea-1.15 on 09/27/2020, Diagnosis-Afib, and last seen by Justin, [facility] on 04/17/2021. Dose is appropriate based on dosing criteria. Will send in refill to requested pharmacy.

## 2021-05-07 LAB — CUP PACEART REMOTE DEVICE CHECK
Battery Remaining Longevity: 105 mo
Battery Remaining Longevity: 106 mo
Battery Remaining Percentage: 89 %
Battery Remaining Percentage: 89 %
Battery Voltage: 3.01 V
Battery Voltage: 3.01 V
Brady Statistic AP VP Percent: 48 %
Brady Statistic AP VP Percent: 48 %
Brady Statistic AP VS Percent: 1 %
Brady Statistic AP VS Percent: 1 %
Brady Statistic AS VP Percent: 41 %
Brady Statistic AS VP Percent: 41 %
Brady Statistic AS VS Percent: 3.3 %
Brady Statistic AS VS Percent: 3.3 %
Brady Statistic RA Percent Paced: 41 %
Brady Statistic RA Percent Paced: 41 %
Brady Statistic RV Percent Paced: 89 %
Brady Statistic RV Percent Paced: 89 %
Date Time Interrogation Session: 20220627020020
Date Time Interrogation Session: 20220627073505
Implantable Lead Implant Date: 20210327
Implantable Lead Implant Date: 20210327
Implantable Lead Implant Date: 20210327
Implantable Lead Implant Date: 20210327
Implantable Lead Location: 753859
Implantable Lead Location: 753860
Implantable Lead Location: 753859
Implantable Lead Location: 753860
Implantable Pulse Generator Implant Date: 20210327
Implantable Pulse Generator Implant Date: 20210327
Lead Channel Impedance Value: 460 Ohm
Lead Channel Impedance Value: 550 Ohm
Lead Channel Impedance Value: 460 Ohm
Lead Channel Impedance Value: 550 Ohm
Lead Channel Pacing Threshold Amplitude: 0.75 V
Lead Channel Pacing Threshold Amplitude: 0.75 V
Lead Channel Pacing Threshold Amplitude: 0.75 V
Lead Channel Pacing Threshold Amplitude: 0.75 V
Lead Channel Pacing Threshold Pulse Width: 0.5 ms
Lead Channel Pacing Threshold Pulse Width: 0.5 ms
Lead Channel Pacing Threshold Pulse Width: 0.5 ms
Lead Channel Pacing Threshold Pulse Width: 0.5 ms
Lead Channel Sensing Intrinsic Amplitude: 1.2 mV
Lead Channel Sensing Intrinsic Amplitude: 12 mV
Lead Channel Sensing Intrinsic Amplitude: 1.2 mV
Lead Channel Sensing Intrinsic Amplitude: 12 mV
Lead Channel Setting Pacing Amplitude: 1 V
Lead Channel Setting Pacing Amplitude: 1.75 V
Lead Channel Setting Pacing Amplitude: 1 V
Lead Channel Setting Pacing Amplitude: 1.75 V
Lead Channel Setting Pacing Pulse Width: 0.5 ms
Lead Channel Setting Pacing Pulse Width: 0.5 ms
Lead Channel Setting Sensing Sensitivity: 4 mV
Lead Channel Setting Sensing Sensitivity: 4 mV
Pulse Gen Model: 2272
Pulse Gen Model: 2272
Pulse Gen Serial Number: 3814642
Pulse Gen Serial Number: 3814642

## 2021-05-24 NOTE — Progress Notes (Signed)
Remote pacemaker transmission.   

## 2021-08-05 ENCOUNTER — Ambulatory Visit (INDEPENDENT_AMBULATORY_CARE_PROVIDER_SITE_OTHER): Payer: Medicare Other

## 2021-08-05 DIAGNOSIS — I442 Atrioventricular block, complete: Secondary | ICD-10-CM

## 2021-08-05 LAB — CUP PACEART REMOTE DEVICE CHECK
Battery Remaining Longevity: 103 mo
Battery Remaining Percentage: 87 %
Battery Voltage: 3.01 V
Brady Statistic AP VP Percent: 48 %
Brady Statistic AP VS Percent: 1 %
Brady Statistic AS VP Percent: 42 %
Brady Statistic AS VS Percent: 2.7 %
Brady Statistic RA Percent Paced: 42 %
Brady Statistic RV Percent Paced: 90 %
Date Time Interrogation Session: 20220926020014
Implantable Lead Implant Date: 20210327
Implantable Lead Implant Date: 20210327
Implantable Lead Location: 753859
Implantable Lead Location: 753860
Implantable Pulse Generator Implant Date: 20210327
Lead Channel Impedance Value: 460 Ohm
Lead Channel Impedance Value: 580 Ohm
Lead Channel Pacing Threshold Amplitude: 0.75 V
Lead Channel Pacing Threshold Amplitude: 0.875 V
Lead Channel Pacing Threshold Pulse Width: 0.5 ms
Lead Channel Pacing Threshold Pulse Width: 0.5 ms
Lead Channel Sensing Intrinsic Amplitude: 1.1 mV
Lead Channel Sensing Intrinsic Amplitude: 8.4 mV
Lead Channel Setting Pacing Amplitude: 1.125
Lead Channel Setting Pacing Amplitude: 1.75 V
Lead Channel Setting Pacing Pulse Width: 0.5 ms
Lead Channel Setting Sensing Sensitivity: 4 mV
Pulse Gen Model: 2272
Pulse Gen Serial Number: 3814642

## 2021-08-12 NOTE — Progress Notes (Signed)
Remote pacemaker transmission.   

## 2021-09-16 IMAGING — DX DG CHEST 1V PORT
1 series · 2 of 2 positions shown · non-contrast
Comparison: None.

CLINICAL DATA: Pacemaker placement, malfunctioning

EXAM:
PORTABLE CHEST 1 VIEW

[Series 1: chest ap · 0.14mm/px · 2 of 2 slices shown]
[im 1/2]
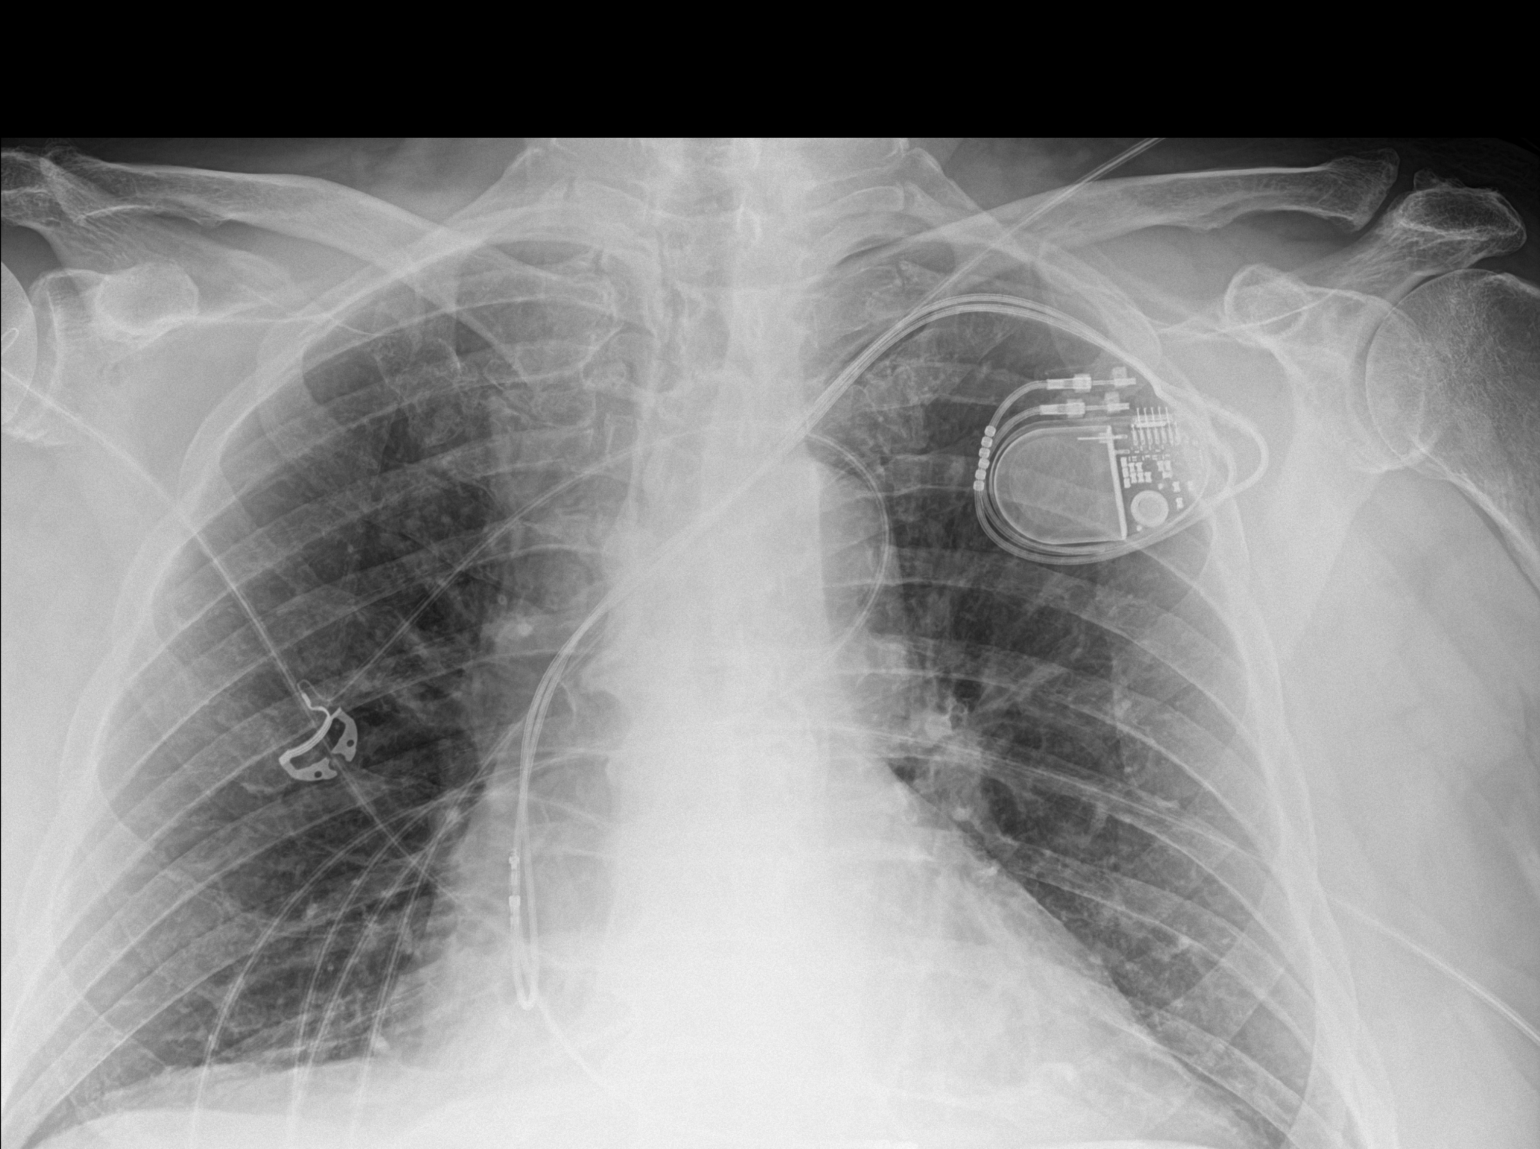
[im 2/2]
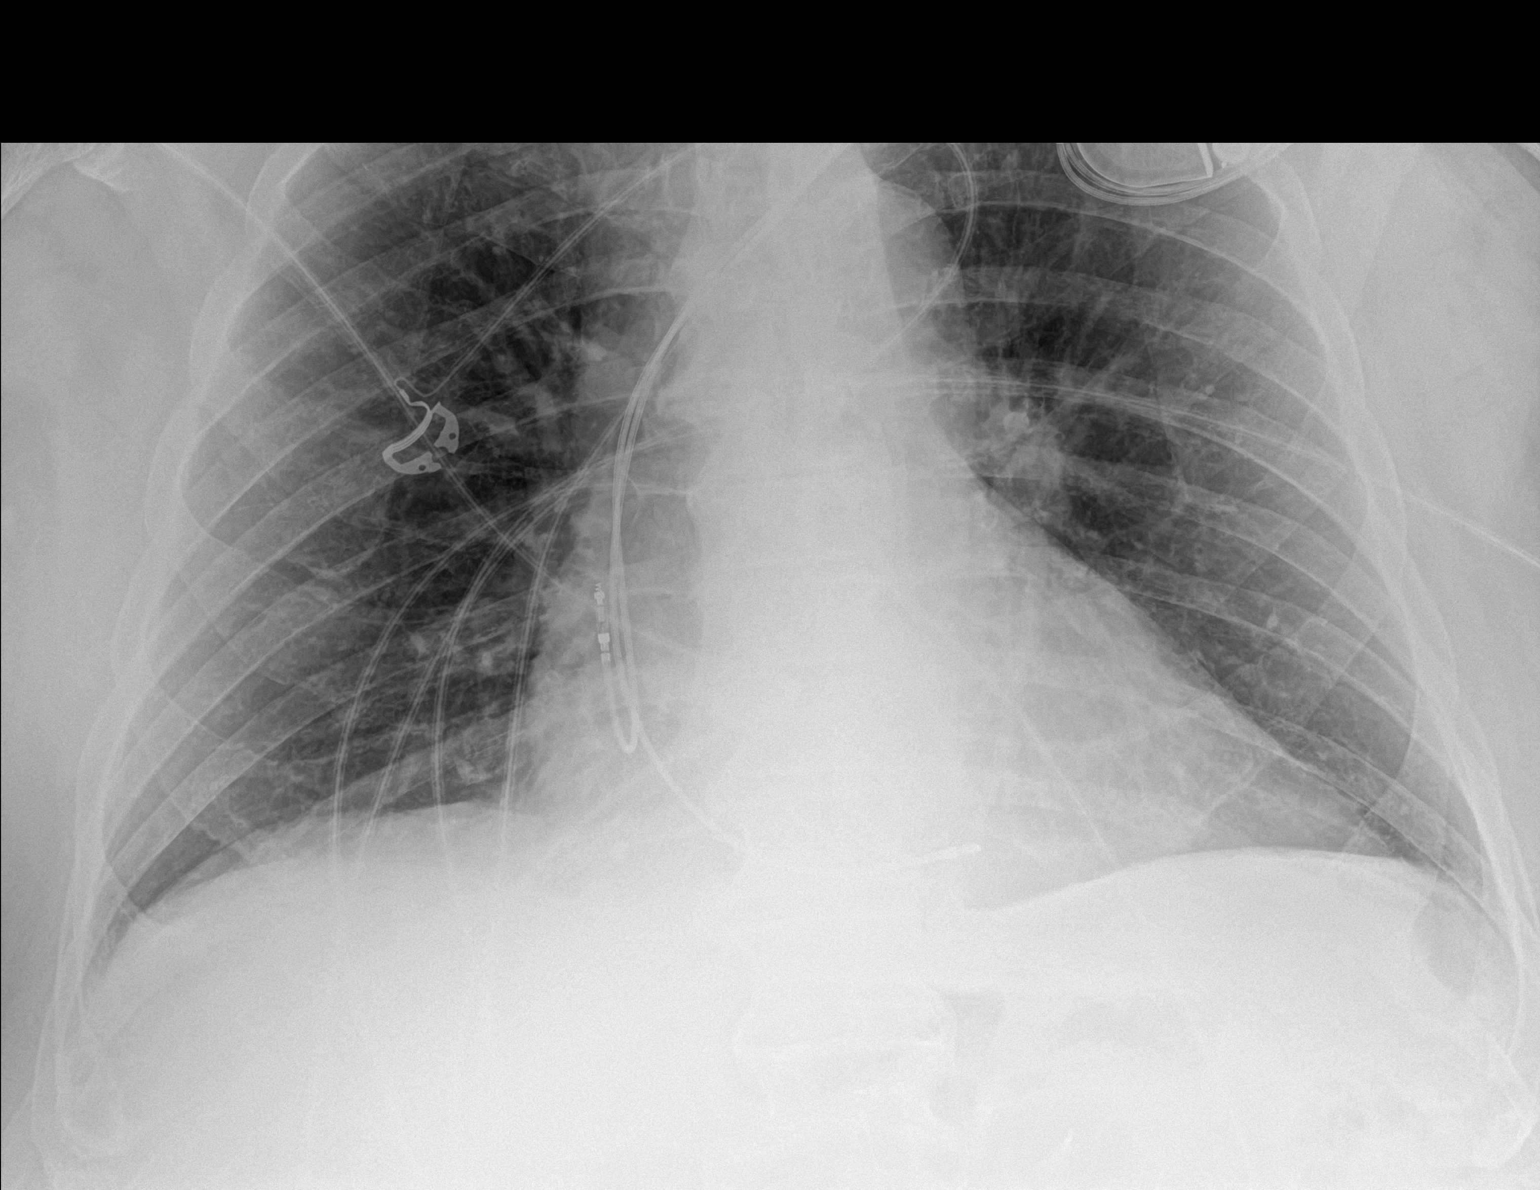

[2 of 2 positions shown; findings below may reference images not displayed]

FINDINGS: There is no focal consolidation. There is no pleural effusion or
pneumothorax. The heart and mediastinal contours are unremarkable.
There is a dual lead cardiac pacemaker with a single lead projecting
over the right atrium and a second lead projecting over the
ventricle.

There is no acute osseous abnormality.
IMPRESSION: 1. No acute cardiopulmonary disease.
2. Dual lead cardiac pacemaker in satisfactory position on a single
view.

## 2021-10-15 ENCOUNTER — Encounter: Payer: Medicare Other | Admitting: Student

## 2021-10-28 NOTE — Progress Notes (Signed)
Electrophysiology Office Note Date: 10/29/2021  ID:  Bill LownWilliam Thomas Young, DOB 08-23-1946, MRN [id number]  PCP: Bill MinaHedrick, James, MD Primary Cardiologist: None Electrophysiologist: Bill RangeJames Allred, MD   CC: Pacemaker follow-up  Bill Young is a 75 y.o. male seen today for Bill RangeJames Allred, MD for routine electrophysiology followup.  Since last being seen in our clinic the patient reports doing well.  he denies chest pain, palpitations, dyspnea, PND, orthopnea, nausea, vomiting, dizziness, syncope, edema, weight gain, or early satiety.  Device History: St. Jude Dual Chamber PPM implanted 01/2020 for symptomatic complete heart block  Past Medical History:  Diagnosis Date   Complete heart block (HCC) 02/04/2020   Hyperlipidemia    Hypertension    Third degree heart block (HCC) 02/03/2020   Past Surgical History:  Procedure Laterality Date   CARDIOVERSION N/A 10/09/2020   Procedure: CARDIOVERSION;  Surgeon: Chilton Siandolph, Tiffany, MD;  [facility];  Service: Cardiovascular;  Laterality: N/A;   PACEMAKER IMPLANT N/A 02/04/2020   Procedure: PACEMAKER IMPLANT;  Surgeon: Bill RangeAllred, James, MD;  Location: [facility] INVASIVE CV LAB;  Service: Cardiovascular;  Laterality: N/A;   TEMPORARY PACEMAKER N/A 02/03/2020   Procedure: TEMPORARY PACEMAKER;  Surgeon: Marcina MillardParaschos, Alexander, MD;  Location: [facility] INVASIVE CV LAB;  Service: Cardiovascular;  Laterality: N/A;    Current Outpatient Medications  Medication Sig Dispense Refill   amLODipine (NORVASC) 2.5 MG tablet Take 2.5 mg by mouth daily.     apixaban (ELIQUIS) 5 MG TABS tablet Take 1 tablet (5 mg total) by mouth 2 (two) times daily. 60 tablet 5   Ascorbic Acid (VITAMIN C) 1000 MG tablet Take 1,000 mg by mouth daily.     Cholecalciferol (VITAMIN D3) 50 MCG (2000 UT) capsule Take 2,000 Units by mouth in the morning and at bedtime.     fenofibrate 160 MG tablet Take 160 mg by mouth at bedtime.      losartan (COZAAR) 100 MG tablet Take 100 mg by  mouth daily.     metFORMIN (GLUCOPHAGE-XR) 500 MG 24 hr tablet Take 500 mg by mouth daily with breakfast.     Multiple Vitamins-Minerals (MULTIVITAMIN ADULTS 50+) TABS Take by mouth.     No current facility-administered medications for this visit.    Allergies:   Epinephrine and Lidocaine   Social History: Social History   Socioeconomic History   Marital status: Married    Spouse name: Not on file   Number of children: Not on file   Years of education: Not on file   Highest education level: Not on file  Occupational History   Not on file  Tobacco Use   Smoking status: Former    Types: Cigarettes    Quit date: 821985    Years since quitting: 37.9   Smokeless tobacco: Never  Substance and Sexual Activity   Alcohol use: Never   Drug use: Never   Sexual activity: Not on file  Other Topics Concern   Not on file  Social History Narrative   Not on file   Social Determinants of Health   Financial Resource Strain: Not on file  Food Insecurity: Not on file  Transportation Needs: Not on file  Physical Activity: Not on file  Stress: Not on file  Social Connections: Not on file  Intimate Partner Violence: Not on file    Family History: No family history on file.   Review of Systems: All other systems reviewed and are otherwise negative except as noted above.  Physical Exam: Vitals:   10/29/21  0932  BP: (!) 152/94  Pulse: 92  SpO2: 98%  Weight: 283 lb (128.4 kg)  Height: 5\' 11"  (1.803 m)     GEN- The patient is well appearing, alert and oriented x 3 today.   HEENT: normocephalic, atraumatic; sclera clear, conjunctiva pink; hearing intact; oropharynx clear; neck supple  Lungs- Clear to ausculation bilaterally, normal work of breathing.  No wheezes, rales, rhonchi Heart- Regular rate and rhythm, no murmurs, rubs or gallops  GI- soft, non-tender, non-distended, bowel sounds present  Extremities- no clubbing or cyanosis. No edema MS- no significant deformity or  atrophy Skin- warm and dry, no rash or lesion; PPM pocket well healed Psych- euthymic mood, full affect Neuro- strength and sensation are intact  PPM Interrogation- reviewed in detail today,  See PACEART report  EKG:  EKG is ordered today. Personal review of ekg ordered today shows NSR at 89 bpm   Recent Labs: No results found for requested labs within last 8760 hours.   Wt Readings from Last 3 Encounters:  10/29/21 283 lb (128.4 kg)  04/17/21 282 lb (127.9 kg)  10/16/20 275 lb 3.2 oz (124.8 kg)     Other studies Reviewed: Additional studies/ records that were reviewed today include: Previous EP office notes, Previous remote checks, Most recent labwork.   Assessment and Plan:  1. Symptomatic bradycardia s/p St. Jude PPM  Normal PPM function See Pace Art report No changes today  2. Paroxysmal atrial fibrillation <1%% burden by device Continue eliquis for CHA2DS2VASC of at least 4.     3. HTN Stable on current regimen    4. HLD Continue crestor 10 mg daily. Per PCP  Current medicines are reviewed at length with the patient today.    Labs/ tests ordered today include:  Orders Placed This Encounter  Procedures   EKG 12-Lead    Disposition:   Follow up with Dr. 14/07/21 in 6 months in [place] at pt request.    Signed, Lalla Brothers, PA-C  10/29/2021 10:14 AM  [facility] [address] [place] [phone] (office) [phone] (fax)

## 2021-10-29 ENCOUNTER — Ambulatory Visit (INDEPENDENT_AMBULATORY_CARE_PROVIDER_SITE_OTHER): Payer: Medicare Other | Admitting: Student

## 2021-10-29 ENCOUNTER — Encounter: Payer: Self-pay | Admitting: Student

## 2021-10-29 ENCOUNTER — Other Ambulatory Visit: Payer: Self-pay

## 2021-10-29 VITALS — BP 152/94 | HR 92 | Ht 71.0 in | Wt 283.0 lb

## 2021-10-29 DIAGNOSIS — I10 Essential (primary) hypertension: Secondary | ICD-10-CM | POA: Diagnosis not present

## 2021-10-29 DIAGNOSIS — I442 Atrioventricular block, complete: Secondary | ICD-10-CM | POA: Diagnosis not present

## 2021-10-29 DIAGNOSIS — I480 Paroxysmal atrial fibrillation: Secondary | ICD-10-CM

## 2021-10-29 DIAGNOSIS — E782 Mixed hyperlipidemia: Secondary | ICD-10-CM

## 2021-10-29 LAB — CUP PACEART INCLINIC DEVICE CHECK
Battery Remaining Longevity: 98 mo
Battery Voltage: 3.01 V
Brady Statistic RA Percent Paced: 43 %
Brady Statistic RV Percent Paced: 91 %
Date Time Interrogation Session: 20221220101532
Implantable Lead Implant Date: 20210327
Implantable Lead Implant Date: 20210327
Implantable Lead Location: 753859
Implantable Lead Location: 753860
Implantable Pulse Generator Implant Date: 20210327
Lead Channel Impedance Value: 450 Ohm
Lead Channel Impedance Value: 550 Ohm
Lead Channel Pacing Threshold Amplitude: 0.75 V
Lead Channel Pacing Threshold Amplitude: 0.875 V
Lead Channel Pacing Threshold Pulse Width: 0.5 ms
Lead Channel Pacing Threshold Pulse Width: 0.5 ms
Lead Channel Sensing Intrinsic Amplitude: 1 mV
Lead Channel Sensing Intrinsic Amplitude: 6.7 mV
Lead Channel Setting Pacing Amplitude: 1 V
Lead Channel Setting Pacing Amplitude: 1.875
Lead Channel Setting Pacing Pulse Width: 0.5 ms
Lead Channel Setting Sensing Sensitivity: 4 mV
Pulse Gen Model: 2272
Pulse Gen Serial Number: 3814642

## 2021-10-29 NOTE — Patient Instructions (Signed)
Medication Instructions:  Your physician recommends that you continue on your current medications as directed. Please refer to the Current Medication list given to you today.  *If you need a refill on your cardiac medications before your next appointment, please call your pharmacy*   Lab Work: None If you have labs (blood work) drawn today and your tests are completely normal, you will receive your results only by: MyChart Message (if you have MyChart) OR A paper copy in the mail If you have any lab test that is abnormal or we need to change your treatment, we will call you to review the results.   Follow-Up: At [facility], you and your health needs are our priority.  As part of our continuing mission to provide you with exceptional heart care, we have created designated Provider Care Teams.  These Care Teams include your primary Cardiologist (physician) and Advanced Practice Providers (APPs -  Physician Assistants and Nurse Practitioners) who all work together to provide you with the care you need, when you need it.  Your next appointment:   6 month(s)  The format for your next appointment:   In Person  Provider:   Steffanie Dunn, MD   :1}

## 2021-11-05 ENCOUNTER — Ambulatory Visit (INDEPENDENT_AMBULATORY_CARE_PROVIDER_SITE_OTHER): Payer: Medicare Other

## 2021-11-05 DIAGNOSIS — I442 Atrioventricular block, complete: Secondary | ICD-10-CM | POA: Diagnosis not present

## 2021-11-10 LAB — CUP PACEART REMOTE DEVICE CHECK
Battery Remaining Longevity: 97 mo
Battery Remaining Percentage: 84 %
Battery Voltage: 3.01 V
Brady Statistic AP VP Percent: 46 %
Brady Statistic AP VS Percent: 1 %
Brady Statistic AS VP Percent: 49 %
Brady Statistic AS VS Percent: 1.3 %
Brady Statistic RA Percent Paced: 43 %
Brady Statistic RV Percent Paced: 95 %
Date Time Interrogation Session: 20221226023014
Implantable Lead Implant Date: 20210327
Implantable Lead Implant Date: 20210327
Implantable Lead Location: 753859
Implantable Lead Location: 753860
Implantable Pulse Generator Implant Date: 20210327
Lead Channel Impedance Value: 440 Ohm
Lead Channel Impedance Value: 490 Ohm
Lead Channel Pacing Threshold Amplitude: 0.625 V
Lead Channel Pacing Threshold Amplitude: 0.75 V
Lead Channel Pacing Threshold Pulse Width: 0.5 ms
Lead Channel Pacing Threshold Pulse Width: 0.5 ms
Lead Channel Sensing Intrinsic Amplitude: 1 mV
Lead Channel Sensing Intrinsic Amplitude: 12 mV
Lead Channel Setting Pacing Amplitude: 0.875
Lead Channel Setting Pacing Amplitude: 1.75 V
Lead Channel Setting Pacing Pulse Width: 0.5 ms
Lead Channel Setting Sensing Sensitivity: 4 mV
Pulse Gen Model: 2272
Pulse Gen Serial Number: 3814642

## 2021-11-14 NOTE — Progress Notes (Signed)
Remote pacemaker transmission.   

## 2021-12-04 ENCOUNTER — Other Ambulatory Visit (HOSPITAL_COMMUNITY): Payer: Self-pay | Admitting: Physician Assistant

## 2022-02-03 ENCOUNTER — Ambulatory Visit (INDEPENDENT_AMBULATORY_CARE_PROVIDER_SITE_OTHER): Payer: Medicare Other

## 2022-02-03 DIAGNOSIS — I442 Atrioventricular block, complete: Secondary | ICD-10-CM | POA: Diagnosis not present

## 2022-02-04 LAB — CUP PACEART REMOTE DEVICE CHECK
Battery Remaining Longevity: 94 mo
Battery Remaining Percentage: 82 %
Battery Voltage: 3.01 V
Brady Statistic AP VP Percent: 52 %
Brady Statistic AP VS Percent: 1 %
Brady Statistic AS VP Percent: 40 %
Brady Statistic AS VS Percent: 1.8 %
Brady Statistic RA Percent Paced: 47 %
Brady Statistic RV Percent Paced: 92 %
Date Time Interrogation Session: 20230327020013
Implantable Lead Implant Date: 20210327
Implantable Lead Implant Date: 20210327
Implantable Lead Location: 753859
Implantable Lead Location: 753860
Implantable Pulse Generator Implant Date: 20210327
Lead Channel Impedance Value: 430 Ohm
Lead Channel Impedance Value: 540 Ohm
Lead Channel Pacing Threshold Amplitude: 0.875 V
Lead Channel Pacing Threshold Amplitude: 1.25 V
Lead Channel Pacing Threshold Pulse Width: 0.5 ms
Lead Channel Pacing Threshold Pulse Width: 0.5 ms
Lead Channel Sensing Intrinsic Amplitude: 0.9 mV
Lead Channel Sensing Intrinsic Amplitude: 9.6 mV
Lead Channel Setting Pacing Amplitude: 1.125
Lead Channel Setting Pacing Amplitude: 2.25 V
Lead Channel Setting Pacing Pulse Width: 0.5 ms
Lead Channel Setting Sensing Sensitivity: 4 mV
Pulse Gen Model: 2272
Pulse Gen Serial Number: 3814642

## 2022-02-13 NOTE — Progress Notes (Signed)
Remote pacemaker transmission.   

## 2022-04-30 ENCOUNTER — Ambulatory Visit (INDEPENDENT_AMBULATORY_CARE_PROVIDER_SITE_OTHER): Payer: Medicare Other | Admitting: Cardiology

## 2022-04-30 ENCOUNTER — Encounter: Payer: Self-pay | Admitting: Cardiology

## 2022-04-30 VITALS — BP 128/82 | HR 70 | Ht 71.0 in | Wt 285.0 lb

## 2022-04-30 DIAGNOSIS — I480 Paroxysmal atrial fibrillation: Secondary | ICD-10-CM

## 2022-04-30 DIAGNOSIS — Z950 Presence of cardiac pacemaker: Secondary | ICD-10-CM

## 2022-04-30 DIAGNOSIS — I442 Atrioventricular block, complete: Secondary | ICD-10-CM | POA: Diagnosis not present

## 2022-04-30 LAB — PACEMAKER DEVICE OBSERVATION

## 2022-04-30 NOTE — Patient Instructions (Signed)
Medications: Your physician recommends that you continue on your current medications as directed. Please refer to the Current Medication list given to you today. *If you need a refill on your cardiac medications before your next appointment, please call your pharmacy*  Lab Work: None. If you have labs (blood work) drawn today and your tests are completely normal, you will receive your results only by: MyChart Message (if you have MyChart) OR A paper copy in the mail If you have any lab test that is abnormal or we need to change your treatment, we will call you to review the results.  Testing/Procedures: Your physician has requested that you have an echocardiogram. Echocardiography is a painless test that uses sound waves to create images of your heart. It provides your doctor with information about the size and shape of your heart and how well your heart's chambers and valves are working. This procedure takes approximately one hour. There are no restrictions for this procedure.   Follow-Up: At [facility], you and your health needs are our priority.  As part of our continuing mission to provide you with exceptional heart care, we have created designated Provider Care Teams.  These Care Teams include your primary Cardiologist (physician) and Advanced Practice Providers (APPs -  Physician Assistants and Nurse Practitioners) who all work together to provide you with the care you need, when you need it.  Your physician wants you to follow-up in: 12 months with Steffanie Dunn, MD   We recommend signing up for the patient portal called "MyChart".  Sign up information is provided on this After Visit Summary.  MyChart is used to connect with patients for Virtual Visits (Telemedicine).  Patients are able to view lab/test results, encounter notes, upcoming appointments, etc.  Non-urgent messages can be sent to your provider as well.   To learn more about what you can do with MyChart, go to  [email or web address].    Any Other Special Instructions Will Be Listed Below (If Applicable).

## 2022-04-30 NOTE — Progress Notes (Signed)
Electrophysiology Office Follow up Visit Note:    Date:  04/30/2022   ID:  Bill Young, DOB Oct 10, 1946, MRN [id number]  PCP:  Jerl Mina, MD  [facility] Cardiologist:  None  [facility] Electrophysiologist:  Lanier Prude, MD    Interval History:    Bill Young is a 76 y.o. male who presents for a follow up visit.  They were last seen by Otilio Saber in clinic October 29, 2021.  The patient has been previously seen by Dr. Johney Frame.  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  The patient also has a history of paroxysmal atrial fibrillation with a less than 1% A-fib burden.  He is on Eliquis for stroke prophylaxis.       Past Medical History:  Diagnosis Date   Complete heart block (HCC) 02/04/2020   Hyperlipidemia    Hypertension    Third degree heart block (HCC) 02/03/2020    Past Surgical History:  Procedure Laterality Date   CARDIOVERSION N/A 10/09/2020   Procedure: CARDIOVERSION;  Surgeon: Chilton Si, MD;  Location: [facility] ENDOSCOPY;  Service: Cardiovascular;  Laterality: N/A;   PACEMAKER IMPLANT N/A 02/04/2020   Procedure: PACEMAKER IMPLANT;  Surgeon: [name], MD;  Location: [facility] INVASIVE CV LAB;  Service: Cardiovascular;  Laterality: N/A;   TEMPORARY PACEMAKER N/A 02/03/2020   Procedure: TEMPORARY PACEMAKER;  Surgeon: Marcina Millard, MD;  Location: [facility] INVASIVE CV LAB;  Service: Cardiovascular;  Laterality: N/A;    Current Medications: Current Meds  Medication Sig   amLODipine (NORVASC) 2.5 MG tablet Take 2.5 mg by mouth daily.   Ascorbic Acid (VITAMIN C) 1000 MG tablet Take 1,000 mg by mouth daily.   atorvastatin (LIPITOR) 20 MG tablet Take 1 tablet by mouth daily.   Cholecalciferol (VITAMIN D3) 50 MCG (2000 UT) capsule Take 2,000 Units by mouth in the morning and at bedtime.   ELIQUIS 5 MG TABS tablet Take 1 tablet by mouth twice daily   fenofibrate 160 MG  tablet Take 160 mg by mouth at bedtime.    gabapentin (NEURONTIN) 100 MG capsule Take 1 capsule by mouth daily.   losartan (COZAAR) 100 MG tablet Take 100 mg by mouth daily.   metFORMIN (GLUCOPHAGE-XR) 500 MG 24 hr tablet Take 500 mg by mouth daily with breakfast.   Multiple Vitamins-Minerals (MULTIVITAMIN ADULTS 50+) TABS Take by mouth.     Allergies:   Epinephrine and Lidocaine   Social History   Socioeconomic History   Marital status: Married    Spouse name: Not on file   Number of children: Not on file   Years of education: Not on file   Highest education level: Not on file  Occupational History   Not on file  Tobacco Use   Smoking status: Former    Types: Cigarettes    Quit date: 74    Years since quitting: 38.4   Smokeless tobacco: Never  Substance and Sexual Activity   Alcohol use: Never   Drug use: Never   Sexual activity: Not on file  Other Topics Concern   Not on file  Social History Narrative   Not on file   Social Determinants of Health   Financial Resource Strain: Not on file  Food Insecurity: Not on file  Transportation Needs: Not on file  Physical Activity: Not on file  Stress: Not on file  Social Connections: Not on file     Family History: The patient's family history is  not on file.  ROS:   Please see the history of present illness.    All other systems reviewed and are negative.  EKGs/Labs/Other Studies Reviewed:    The following studies were reviewed today:  April 30, 2022 in clinic device interrogation personally reviewed Battery longevity 7.5 years Lead parameters stable DDDR 60-1 20 Less than 1% A-fib burden 90% ventricular paced, 46% atrial paced  EKG:  The ekg ordered today demonstrates AV sequential pacing.  Paced QRS is 156 ms.  Recent Labs: No results found for requested labs within last 365 days.  Recent Lipid Panel No results found for: "CHOL", "TRIG", "HDL", "CHOLHDL", "VLDL", "LDLCALC", "LDLDIRECT"  Physical Exam:     VS:  BP 128/82   Pulse 70   Ht 5\' 11"  (1.803 m)   Wt 285 lb (129.3 kg)   SpO2 96%   BMI 39.75 kg/m     Wt Readings from Last 3 Encounters:  04/30/22 285 lb (129.3 kg)  10/29/21 283 lb (128.4 kg)  04/17/21 282 lb (127.9 kg)     GEN:  Well nourished, well developed in no acute distress HEENT: Normal NECK: No JVD; No carotid bruits LYMPHATICS: No lymphadenopathy CARDIAC: RRR, no murmurs, rubs, gallops.  Pacemaker pocket well-healed. RESPIRATORY:  Clear to auscultation without rales, wheezing or rhonchi  ABDOMEN: Soft, non-tender, non-distended MUSCULOSKELETAL:  No edema; No deformity  SKIN: Warm and dry NEUROLOGIC:  Alert and oriented x 3 PSYCHIATRIC:  Normal affect        ASSESSMENT:    1. Complete heart block (HCC)   2. Pacemaker   3. Paroxysmal atrial fibrillation (HCC)    PLAN:    In order of problems listed above:  #Complete heart block #Permanent pacemaker in situ Device functioning appropriately.  Continue remote monitoring.  I will update an echocardiogram given the patient's high pacing burden.   #Paroxysmal atrial fibrillation Less than 1% burden on the device.  Continue Eliquis for stroke prophylaxis.  Follow-up in 1 year or sooner as needed.  APP appointment okay.\     Medication Adjustments/Labs and Tests Ordered: Current medicines are reviewed at length with the patient today.  Concerns regarding medicines are outlined above.  No orders of the defined types were placed in this encounter.  No orders of the defined types were placed in this encounter.    Signed, 06/17/21, MD, [facility], [facility] 04/30/2022 10:36 AM    Electrophysiology [facility]

## 2022-05-05 ENCOUNTER — Ambulatory Visit (INDEPENDENT_AMBULATORY_CARE_PROVIDER_SITE_OTHER): Payer: Medicare Other

## 2022-05-05 DIAGNOSIS — I442 Atrioventricular block, complete: Secondary | ICD-10-CM | POA: Diagnosis not present

## 2022-05-06 LAB — CUP PACEART REMOTE DEVICE CHECK
Battery Remaining Longevity: 91 mo
Battery Remaining Percentage: 79 %
Battery Voltage: 3.01 V
Brady Statistic AP VP Percent: 55 %
Brady Statistic AP VS Percent: 1 %
Brady Statistic AS VP Percent: 24 %
Brady Statistic AS VS Percent: 2.9 %
Brady Statistic RA Percent Paced: 39 %
Brady Statistic RV Percent Paced: 79 %
Date Time Interrogation Session: 20230626020011
Implantable Lead Implant Date: 20210327
Implantable Lead Implant Date: 20210327
Implantable Lead Location: 753859
Implantable Lead Location: 753860
Implantable Pulse Generator Implant Date: 20210327
Lead Channel Impedance Value: 400 Ohm
Lead Channel Impedance Value: 550 Ohm
Lead Channel Pacing Threshold Amplitude: 0.75 V
Lead Channel Pacing Threshold Amplitude: 0.75 V
Lead Channel Pacing Threshold Pulse Width: 0.5 ms
Lead Channel Pacing Threshold Pulse Width: 0.5 ms
Lead Channel Sensing Intrinsic Amplitude: 0.7 mV
Lead Channel Sensing Intrinsic Amplitude: 10.1 mV
Lead Channel Setting Pacing Amplitude: 1 V
Lead Channel Setting Pacing Amplitude: 1.75 V
Lead Channel Setting Pacing Pulse Width: 0.5 ms
Lead Channel Setting Sensing Sensitivity: 4 mV
Pulse Gen Model: 2272
Pulse Gen Serial Number: 3814642

## 2022-05-26 ENCOUNTER — Encounter: Payer: Self-pay | Admitting: Cardiology

## 2022-05-29 NOTE — Progress Notes (Signed)
Remote pacemaker transmission.   

## 2022-06-03 ENCOUNTER — Ambulatory Visit (INDEPENDENT_AMBULATORY_CARE_PROVIDER_SITE_OTHER): Payer: Medicare Other

## 2022-06-03 DIAGNOSIS — I442 Atrioventricular block, complete: Secondary | ICD-10-CM

## 2022-06-03 DIAGNOSIS — Z950 Presence of cardiac pacemaker: Secondary | ICD-10-CM | POA: Diagnosis not present

## 2022-06-03 DIAGNOSIS — I480 Paroxysmal atrial fibrillation: Secondary | ICD-10-CM

## 2022-06-03 LAB — ECHOCARDIOGRAM COMPLETE
AR max vel: 5.39 cm2
AV Area VTI: 5.57 cm2
AV Area mean vel: 4.84 cm2
AV Mean grad: 2 mmHg
AV Peak grad: 3.7 mmHg
Ao pk vel: 0.96 m/s
S' Lateral: 3.8 cm

## 2022-06-03 MED ORDER — PERFLUTREN LIPID MICROSPHERE
1.0000 mL | INTRAVENOUS | Status: AC | PRN
  Administered 2022-06-03: 2 mL via INTRAVENOUS

## 2022-08-04 ENCOUNTER — Ambulatory Visit (INDEPENDENT_AMBULATORY_CARE_PROVIDER_SITE_OTHER): Payer: Medicare Other

## 2022-08-04 DIAGNOSIS — I442 Atrioventricular block, complete: Secondary | ICD-10-CM | POA: Diagnosis not present

## 2022-08-05 LAB — CUP PACEART REMOTE DEVICE CHECK
Battery Remaining Longevity: 90 mo
Battery Remaining Percentage: 76 %
Battery Voltage: 3.01 V
Brady Statistic AP VP Percent: 52 %
Brady Statistic AP VS Percent: 1 %
Brady Statistic AS VP Percent: 33 %
Brady Statistic AS VS Percent: 3 %
Brady Statistic RA Percent Paced: 43 %
Brady Statistic RV Percent Paced: 85 %
Date Time Interrogation Session: 20230925020018
Implantable Lead Implant Date: 20210327
Implantable Lead Implant Date: 20210327
Implantable Lead Location: 753859
Implantable Lead Location: 753860
Implantable Pulse Generator Implant Date: 20210327
Lead Channel Impedance Value: 360 Ohm
Lead Channel Impedance Value: 580 Ohm
Lead Channel Pacing Threshold Amplitude: 0.5 V
Lead Channel Pacing Threshold Amplitude: 0.75 V
Lead Channel Pacing Threshold Pulse Width: 0.5 ms
Lead Channel Pacing Threshold Pulse Width: 0.5 ms
Lead Channel Sensing Intrinsic Amplitude: 1.2 mV
Lead Channel Sensing Intrinsic Amplitude: 10 mV
Lead Channel Setting Pacing Amplitude: 1 V
Lead Channel Setting Pacing Amplitude: 1.5 V
Lead Channel Setting Pacing Pulse Width: 0.5 ms
Lead Channel Setting Sensing Sensitivity: 4 mV
Pulse Gen Model: 2272
Pulse Gen Serial Number: 3814642

## 2022-08-18 NOTE — Progress Notes (Signed)
Remote pacemaker transmission.   

## 2022-11-05 ENCOUNTER — Ambulatory Visit (INDEPENDENT_AMBULATORY_CARE_PROVIDER_SITE_OTHER): Payer: Medicare Other

## 2022-11-05 DIAGNOSIS — I442 Atrioventricular block, complete: Secondary | ICD-10-CM | POA: Diagnosis not present

## 2022-11-06 LAB — CUP PACEART REMOTE DEVICE CHECK
Battery Remaining Longevity: 86 mo
Battery Remaining Percentage: 74 %
Battery Voltage: 3.01 V
Brady Statistic AP VP Percent: 52 %
Brady Statistic AP VS Percent: 1 %
Brady Statistic AS VP Percent: 38 %
Brady Statistic AS VS Percent: 2.2 %
Brady Statistic RA Percent Paced: 46 %
Brady Statistic RV Percent Paced: 90 %
Date Time Interrogation Session: 20231227074555
Implantable Lead Connection Status: 753985
Implantable Lead Connection Status: 753985
Implantable Lead Implant Date: 20210327
Implantable Lead Implant Date: 20210327
Implantable Lead Location: 753859
Implantable Lead Location: 753860
Implantable Pulse Generator Implant Date: 20210327
Lead Channel Impedance Value: 360 Ohm
Lead Channel Impedance Value: 590 Ohm
Lead Channel Pacing Threshold Amplitude: 0.625 V
Lead Channel Pacing Threshold Amplitude: 0.875 V
Lead Channel Pacing Threshold Pulse Width: 0.5 ms
Lead Channel Pacing Threshold Pulse Width: 0.5 ms
Lead Channel Sensing Intrinsic Amplitude: 1.3 mV
Lead Channel Sensing Intrinsic Amplitude: 12 mV
Lead Channel Setting Pacing Amplitude: 1.125
Lead Channel Setting Pacing Amplitude: 1.625
Lead Channel Setting Pacing Pulse Width: 0.5 ms
Lead Channel Setting Sensing Sensitivity: 4 mV
Pulse Gen Model: 2272
Pulse Gen Serial Number: 3814642

## 2022-11-25 NOTE — Progress Notes (Signed)
Remote pacemaker transmission.   

## 2023-02-04 ENCOUNTER — Ambulatory Visit (INDEPENDENT_AMBULATORY_CARE_PROVIDER_SITE_OTHER): Payer: Medicare Other

## 2023-02-04 DIAGNOSIS — I442 Atrioventricular block, complete: Secondary | ICD-10-CM

## 2023-02-04 LAB — CUP PACEART REMOTE DEVICE CHECK
Battery Remaining Longevity: 80 mo
Battery Remaining Percentage: 71 %
Battery Voltage: 3.01 V
Brady Statistic AP VP Percent: 51 %
Brady Statistic AP VS Percent: 1 %
Brady Statistic AS VP Percent: 41 %
Brady Statistic AS VS Percent: 1.8 %
Brady Statistic RA Percent Paced: 46 %
Brady Statistic RV Percent Paced: 92 %
Date Time Interrogation Session: 20240327020015
Implantable Lead Connection Status: 753985
Implantable Lead Connection Status: 753985
Implantable Lead Implant Date: 20210327
Implantable Lead Implant Date: 20210327
Implantable Lead Location: 753859
Implantable Lead Location: 753860
Implantable Pulse Generator Implant Date: 20210327
Lead Channel Impedance Value: 350 Ohm
Lead Channel Impedance Value: 540 Ohm
Lead Channel Pacing Threshold Amplitude: 0.625 V
Lead Channel Pacing Threshold Amplitude: 0.875 V
Lead Channel Pacing Threshold Pulse Width: 0.5 ms
Lead Channel Pacing Threshold Pulse Width: 0.5 ms
Lead Channel Sensing Intrinsic Amplitude: 0.7 mV
Lead Channel Sensing Intrinsic Amplitude: 10.5 mV
Lead Channel Setting Pacing Amplitude: 1.125
Lead Channel Setting Pacing Amplitude: 1.625
Lead Channel Setting Pacing Pulse Width: 0.5 ms
Lead Channel Setting Sensing Sensitivity: 4 mV
Pulse Gen Model: 2272
Pulse Gen Serial Number: 3814642

## 2023-03-16 NOTE — Progress Notes (Signed)
Remote pacemaker transmission.   

## 2023-05-06 ENCOUNTER — Ambulatory Visit: Payer: Medicare Other

## 2023-05-06 DIAGNOSIS — I442 Atrioventricular block, complete: Secondary | ICD-10-CM | POA: Diagnosis not present

## 2023-05-06 LAB — CUP PACEART REMOTE DEVICE CHECK
Battery Remaining Longevity: 79 mo
Battery Remaining Percentage: 69 %
Battery Voltage: 3.01 V
Brady Statistic AP VP Percent: 50 %
Brady Statistic AP VS Percent: 1 %
Brady Statistic AS VP Percent: 43 %
Brady Statistic AS VS Percent: 1.6 %
Brady Statistic RA Percent Paced: 46 %
Brady Statistic RV Percent Paced: 93 %
Date Time Interrogation Session: 20240626020013
Implantable Lead Connection Status: 753985
Implantable Lead Connection Status: 753985
Implantable Lead Implant Date: 20210327
Implantable Lead Implant Date: 20210327
Implantable Lead Location: 753859
Implantable Lead Location: 753860
Implantable Pulse Generator Implant Date: 20210327
Lead Channel Impedance Value: 380 Ohm
Lead Channel Impedance Value: 600 Ohm
Lead Channel Pacing Threshold Amplitude: 0.625 V
Lead Channel Pacing Threshold Amplitude: 0.75 V
Lead Channel Pacing Threshold Pulse Width: 0.5 ms
Lead Channel Pacing Threshold Pulse Width: 0.5 ms
Lead Channel Sensing Intrinsic Amplitude: 1.7 mV
Lead Channel Sensing Intrinsic Amplitude: 9.3 mV
Lead Channel Setting Pacing Amplitude: 1 V
Lead Channel Setting Pacing Amplitude: 1.625
Lead Channel Setting Pacing Pulse Width: 0.5 ms
Lead Channel Setting Sensing Sensitivity: 4 mV
Pulse Gen Model: 2272
Pulse Gen Serial Number: 3814642

## 2023-05-19 NOTE — Progress Notes (Unsigned)
Cardiology Office Note Date:  05/19/2023  Patient ID:  Bill Young, DOB 12/28/1945, MRN [id number] PCP:  Jerl Mina, MD  Cardiologist:  None Electrophysiologist: Lanier Prude, MD  ***refresh   Chief Complaint: 1 year device follow-up  History of Present Illness: Bill Young is a 77 y.o. male with PMH notable for CHB s/p PPM, parox AFib; seen today for Lanier Prude, MD for routine electrophysiology followup.  He last saw Dr. Lalla Brothers 04/2022 for routine follow-up. The patient was doing well, but Dr. Lalla Brothers wanted to update TTE given high V pacing percentage. He had less than 1% AF burden. LVEF was slightly reduced 50-55%  45-50%.   On follow-up today,  *** AF burden, symptoms *** palpitations *** bleeding concerns *** any HF symptoms  - needs updated echo    Since last being seen in our clinic the patient reports doing ***.  he denies chest pain, palpitations, dyspnea, PND, orthopnea, nausea, vomiting, dizziness, syncope, edema, weight gain, or early satiety.     Device Information: St. Jude dual chamber PPM, imp 01/2020; dx CHB   Past Medical History:  Diagnosis Date   Complete heart block (HCC) 02/04/2020   Hyperlipidemia    Hypertension    Third degree heart block (HCC) 02/03/2020    Past Surgical History:  Procedure Laterality Date   CARDIOVERSION N/A 10/09/2020   Procedure: CARDIOVERSION;  Surgeon: Chilton Si, MD;  Location: [facility] ENDOSCOPY;  Service: Cardiovascular;  Laterality: N/A;   PACEMAKER IMPLANT N/A 02/04/2020   Procedure: PACEMAKER IMPLANT;  Surgeon: [name], MD;  Location: [facility] INVASIVE CV LAB;  Service: Cardiovascular;  Laterality: N/A;   TEMPORARY PACEMAKER N/A 02/03/2020   Procedure: TEMPORARY PACEMAKER;  Surgeon: Marcina Millard, MD;  Location: [facility] INVASIVE CV LAB;  Service: Cardiovascular;  Laterality: N/A;    Current Outpatient Medications  Medication Instructions   amLODipine (NORVASC) 2.5 mg, Oral,  Daily   atorvastatin (LIPITOR) 20 MG tablet 1 tablet, Oral, Daily   ELIQUIS 5 MG TABS tablet Take 1 tablet by mouth twice daily   fenofibrate 160 mg, Oral, Daily at bedtime   gabapentin (NEURONTIN) 100 MG capsule 1 capsule, Oral, Daily   losartan (COZAAR) 100 mg, Oral, Daily   metFORMIN (GLUCOPHAGE-XR) 500 mg, Oral, Daily with breakfast   Multiple Vitamins-Minerals (MULTIVITAMIN ADULTS 50+) TABS Oral   vitamin C 1,000 mg, Oral, Daily   Vitamin D3 2,000 Units, Oral, 2 times daily    Social History:  The patient  reports that he quit smoking about 39 years ago. His smoking use included cigarettes. He has never used smokeless tobacco. He reports that he does not drink alcohol and does not use drugs.   Family History:  *** include only if pertinent The patient's family history is not on file.***  ROS:  Please see the history of present illness. All other systems are reviewed and otherwise negative.   PHYSICAL EXAM: *** VS:  There were no vitals taken for this visit. BMI: There is no height or weight on file to calculate BMI.  GEN- The patient is well appearing, alert and oriented x 3 today.   Lungs- Clear to ausculation bilaterally, normal work of breathing.  Heart- {Blank single:19197::"Regular","Irregularly irregular"} rate and rhythm, no murmurs, rubs or gallops Extremities- {EDEMA LEVEL:28147::"No"} peripheral edema, warm, dry Skin-  *** device pocket well-healed, no tethering   Device interrogation done today and reviewed by myself:  Battery *** Lead thresholds, impedence, sensing stable *** *** episodes *** changes made today  EKG is ordered. Personal review of EKG from today shows:  ***        Recent Labs: No results found for requested labs within last 365 days.  No results found for requested labs within last 365 days.   CrCl cannot be calculated (Patient's most recent lab result is older than the maximum 21 days allowed.).   Wt Readings from Last 3 Encounters:   04/30/22 285 lb (129.3 kg)  10/29/21 283 lb (128.4 kg)  04/17/21 282 lb (127.9 kg)     Additional studies reviewed include: Previous EP, cardiology notes.   TTE, 06/03/2022  1. Left ventricular ejection fraction, by estimation, is 45 to 50%. The left ventricle has mildly decreased function. The left ventricle demonstrates regional wall motion abnormalities (hypokinesis of the mid to distal anteroseptal and apical region). Left ventricular diastolic parameters are indeterminate.   2. Right ventricular systolic function is normal. The right ventricular size is normal.   3. Left atrial size was mildly dilated.   4. The mitral valve is normal in structure. Mild mitral valve regurgitation. No evidence of mitral stenosis.   5. The aortic valve is normal in structure. Aortic valve regurgitation is not visualized. No aortic stenosis is present.   6. The inferior vena cava is normal in size with greater than 50% respiratory variability, suggesting right atrial pressure of 3 mmHg.   TTE, 02/04/2020  1. Left ventricular ejection fraction, by estimation, is 50 to 55%. The left ventricle has low normal function. The left ventricle has no regional wall motion abnormalities. Left ventricular diastolic parameters are indeterminate.   2. Right ventricular systolic function is normal. The right ventricular size is normal. There is mildly elevated pulmonary artery systolic pressure.   3. The mitral valve is normal in structure. Trivial mitral valve regurgitation.   4. The aortic valve is tricuspid. Aortic valve regurgitation is not visualized. No aortic stenosis is present.   ASSESSMENT AND PLAN:  #) CHB s/p St. Jude PPM Device functioning well, see paceart for details    #) parox AFib   #) Hypercoag d/t parox Afib CHA2DS2-VASc Score = 4 [CHF History: 0, HTN History: 1, Diabetes History: 1, Stroke History: 0, Vascular Disease History: 0, Age Score: 2, Gender Score: 0].  Therefore, the patient's annual  risk of stroke is 4.8 %.     {Confirm score is correct.  If not, click here to update score.  REFRESH note.  :1}   #) HFmrEF ***   {Are you ordering a CV Procedure (e.g. stress test, cath, DCCV, TEE, etc)?   Press F2        :[id number]}   Current medicines are reviewed at length with the patient today.   The patient {ACTIONS; HAS/DOES NOT HAVE:19233} concerns regarding his medicines.  The following changes were made today:  {NONE DEFAULTED:18576}  Labs/ tests ordered today include: *** No orders of the defined types were placed in this encounter.    Disposition: Follow up with {EPMDS:28135} or EP APP {EPFOLLOW UP:28173}   Signed, Sherie Don, NP  05/19/23  8:42 PM  Electrophysiology [facility]

## 2023-05-20 ENCOUNTER — Encounter: Payer: Self-pay | Admitting: Cardiology

## 2023-05-20 ENCOUNTER — Ambulatory Visit: Payer: Medicare Other | Attending: Cardiology | Admitting: Cardiology

## 2023-05-20 ENCOUNTER — Other Ambulatory Visit
Admission: RE | Admit: 2023-05-20 | Discharge: 2023-05-20 | Disposition: A | Discharge status: Home / Self Care | Payer: Medicare Other | Source: Ambulatory Visit | Attending: Cardiology | Admitting: Cardiology

## 2023-05-20 ENCOUNTER — Telehealth: Payer: Self-pay | Admitting: Cardiology

## 2023-05-20 VITALS — BP 160/82 | HR 76 | Ht 71.0 in | Wt 295.0 lb

## 2023-05-20 DIAGNOSIS — R609 Edema, unspecified: Secondary | ICD-10-CM | POA: Insufficient documentation

## 2023-05-20 DIAGNOSIS — I442 Atrioventricular block, complete: Secondary | ICD-10-CM | POA: Diagnosis not present

## 2023-05-20 DIAGNOSIS — Z950 Presence of cardiac pacemaker: Secondary | ICD-10-CM | POA: Insufficient documentation

## 2023-05-20 DIAGNOSIS — I5022 Chronic systolic (congestive) heart failure: Secondary | ICD-10-CM | POA: Diagnosis present

## 2023-05-20 DIAGNOSIS — D6869 Other thrombophilia: Secondary | ICD-10-CM | POA: Diagnosis not present

## 2023-05-20 DIAGNOSIS — I480 Paroxysmal atrial fibrillation: Secondary | ICD-10-CM | POA: Insufficient documentation

## 2023-05-20 LAB — CUP PACEART INCLINIC DEVICE CHECK
Battery Remaining Longevity: 78 mo
Battery Voltage: 2.99 V
Brady Statistic RA Percent Paced: 45 %
Brady Statistic RV Percent Paced: 93 %
Date Time Interrogation Session: 20240710161302
Implantable Lead Connection Status: 753985
Implantable Lead Connection Status: 753985
Implantable Lead Implant Date: 20210327
Implantable Lead Implant Date: 20210327
Implantable Lead Location: 753859
Implantable Lead Location: 753860
Implantable Pulse Generator Implant Date: 20210327
Lead Channel Impedance Value: 375 Ohm
Lead Channel Impedance Value: 550 Ohm
Lead Channel Pacing Threshold Amplitude: 0.75 V
Lead Channel Pacing Threshold Amplitude: 0.75 V
Lead Channel Pacing Threshold Amplitude: 1 V
Lead Channel Pacing Threshold Amplitude: 1 V
Lead Channel Pacing Threshold Pulse Width: 0.5 ms
Lead Channel Pacing Threshold Pulse Width: 0.5 ms
Lead Channel Pacing Threshold Pulse Width: 0.5 ms
Lead Channel Pacing Threshold Pulse Width: 0.5 ms
Lead Channel Sensing Intrinsic Amplitude: 2.1 mV
Lead Channel Sensing Intrinsic Amplitude: 9.3 mV
Lead Channel Setting Pacing Amplitude: 1.125
Lead Channel Setting Pacing Amplitude: 1.625
Lead Channel Setting Pacing Pulse Width: 0.5 ms
Lead Channel Setting Sensing Sensitivity: 4 mV
Pulse Gen Model: 2272
Pulse Gen Serial Number: 3814642

## 2023-05-20 LAB — BASIC METABOLIC PANEL
Anion gap: 10 (ref 5–15)
BUN: 18 mg/dL (ref 8–23)
CO2: 22 mmol/L (ref 22–32)
Calcium: 9.7 mg/dL (ref 8.9–10.3)
Chloride: 108 mmol/L (ref 98–111)
Creatinine, Ser: 1.05 mg/dL (ref 0.61–1.24)
GFR, Estimated: >60 mL/min (ref >60)
Glucose, Bld: 132 mg/dL — ABNORMAL HIGH (ref 70–99)
Potassium: 4 mmol/L (ref 3.5–5.1)
Sodium: 140 mmol/L (ref 135–145)

## 2023-05-20 LAB — BRAIN NATRIURETIC PEPTIDE: B Natriuretic Peptide: 117.7 pg/mL — ABNORMAL HIGH (ref 0.0–100.0)

## 2023-05-20 MED ORDER — FUROSEMIDE 40 MG PO TABS: ORAL_TABLET | ORAL | 5 refills | Status: DC

## 2023-05-20 NOTE — Telephone Encounter (Signed)
Pt was told to call back to let Suzann, NP know that he is still taking this medication   gabapentin (NEURONTIN) 100 MG capsule

## 2023-05-20 NOTE — Patient Instructions (Addendum)
Medication Instructions:  START furosemide 40 mg by mouth daily for three days then take as needed  *If you need a refill on your cardiac medications before your next appointment, please call your pharmacy*   Lab Work: BMP and BNP today Repeat BMP in one week - Please go to the [facility]. You will check in at the front desk to the right as you walk into the atrium. Valet Parking is offered if needed. - No appointment needed. You may go any day between 7 am and 6 pm.  If you have labs (blood work) drawn today and your tests are completely normal, you will receive your results only by: MyChart Message (if you have MyChart) OR A paper copy in the mail If you have any lab test that is abnormal or we need to change your treatment, we will call you to review the results.   Testing/Procedures: Your physician has requested that you have an echocardiogram. Echocardiography is a painless test that uses sound waves to create images of your heart. It provides your doctor with information about the size and shape of your heart and how well your heart's chambers and valves are working. This procedure takes approximately one hour. There are no restrictions for this procedure. Please do NOT wear cologne, perfume, aftershave, or lotions (deodorant is allowed). Please arrive 15 minutes prior to your appointment time.  Follow-Up: At [facility], you and your health needs are our priority.  As part of our continuing mission to provide you with exceptional heart care, we have created designated Provider Care Teams.  These Care Teams include your primary Cardiologist (physician) and Advanced Practice Providers (APPs -  Physician Assistants and Nurse Practitioners) who all work together to provide you with the care you need, when you need it.  We recommend signing up for the patient portal called "MyChart".  Sign up information is provided on this After Visit Summary.  MyChart is used to connect  with patients for Virtual Visits (Telemedicine).  Patients are able to view lab/test results, encounter notes, upcoming appointments, etc.  Non-urgent messages can be sent to your provider as well.   To learn more about what you can do with MyChart, go to [email or web address].    Your next appointment:   6 month(s)  Provider:   Sherie Don, NP   Other: Please monitor your blood pressure three days per week. Call our office for further instruction if it consistently 130/90 or greater. Thank you!

## 2023-05-26 ENCOUNTER — Telehealth: Payer: Self-pay

## 2023-05-26 NOTE — Telephone Encounter (Signed)
Spoke with patient to remind him to get his repeat BMP drawn at the [facility]. Patient expressed gratitude for the call and states he will have it drawn tomorrow.

## 2023-05-27 ENCOUNTER — Other Ambulatory Visit
Admission: RE | Admit: 2023-05-27 | Discharge: 2023-05-27 | Disposition: A | Discharge status: Home / Self Care | Payer: Medicare Other | Attending: Cardiology | Admitting: Cardiology

## 2023-05-27 ENCOUNTER — Telehealth: Payer: Self-pay | Admitting: Cardiology

## 2023-05-27 DIAGNOSIS — I480 Paroxysmal atrial fibrillation: Secondary | ICD-10-CM | POA: Insufficient documentation

## 2023-05-27 DIAGNOSIS — R609 Edema, unspecified: Secondary | ICD-10-CM | POA: Diagnosis present

## 2023-05-27 DIAGNOSIS — I5022 Chronic systolic (congestive) heart failure: Secondary | ICD-10-CM | POA: Insufficient documentation

## 2023-05-27 DIAGNOSIS — I442 Atrioventricular block, complete: Secondary | ICD-10-CM | POA: Insufficient documentation

## 2023-05-27 LAB — BASIC METABOLIC PANEL
Anion gap: 8 (ref 5–15)
BUN: 25 mg/dL — ABNORMAL HIGH (ref 8–23)
CO2: 26 mmol/L (ref 22–32)
Calcium: 9.9 mg/dL (ref 8.9–10.3)
Chloride: 106 mmol/L (ref 98–111)
Creatinine, Ser: 1.15 mg/dL (ref 0.61–1.24)
GFR, Estimated: >60 mL/min (ref >60)
Glucose, Bld: 150 mg/dL — ABNORMAL HIGH (ref 70–99)
Potassium: 3.7 mmol/L (ref 3.5–5.1)
Sodium: 140 mmol/L (ref 135–145)

## 2023-05-27 NOTE — Telephone Encounter (Signed)
Called patient back, he states he wanted to make Suzann aware of how the lasix did. He states the swelling to improve significantly. He states he has feeling back in his feet again, and advised that per last note he was to take it for 3 days, then take as needed. He will monitor for swelling in legs. Advised for him to also start weighing daily, as well as monitor for the swelling in legs. If he notices he is having to take the lasix more constantly he should notify us. Patient verbalized understanding of this.

## 2023-05-27 NOTE — Progress Notes (Signed)
 Remote pacemaker transmission.   

## 2023-05-27 NOTE — Telephone Encounter (Signed)
Pt c/o medication issue:  1. Name of Medication: Furosemide  2. How are you currently taking this medication (dosage and times per day)? 1 time a day for 3 days  3. Are you having a reaction (difficulty breathing--STAT)?   4. What is your medication issue? Patient said he was told to take Furosemide for 3 days and let [name] know  how it was working. He said the swelling is way down, still  have some.. Patient wants to know if he needs to continue taking the Furosemide?

## 2023-06-16 ENCOUNTER — Ambulatory Visit: Payer: Medicare Other | Attending: Cardiology

## 2023-06-16 DIAGNOSIS — I480 Paroxysmal atrial fibrillation: Secondary | ICD-10-CM | POA: Insufficient documentation

## 2023-06-16 DIAGNOSIS — R609 Edema, unspecified: Secondary | ICD-10-CM | POA: Insufficient documentation

## 2023-06-16 DIAGNOSIS — R600 Localized edema: Secondary | ICD-10-CM | POA: Diagnosis not present

## 2023-06-16 MED ORDER — PERFLUTREN LIPID MICROSPHERE
1.0000 mL | INTRAVENOUS | Status: AC | PRN
Start: 2023-06-16 — End: 2023-06-16
  Administered 2023-06-16: 3 mL via INTRAVENOUS

## 2023-06-29 NOTE — Progress Notes (Unsigned)
  Electrophysiology Office Follow up Visit Note:    Date:  07/01/2023   ID:  Bill Young, DOB Dec 24, 1945, MRN [id number]  PCP:  Bill Mina, MD  [facility] Cardiologist:  None  [facility] Electrophysiologist:  Bill Prude, MD    Interval History:    Bill Young is a 77 y.o. male who presents for a follow up visit.   Last seen June 2023 by me and May 20, 2023 by Bill Young.  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  He has a dual-chamber permanent pacemaker this implanted in March 2021.  Has a high pacing burden.       Past medical, surgical, social and family history were reviewed.  ROS:   Please see the history of present illness.    All other systems reviewed and are negative.  EKGs/Labs/Other Studies Reviewed:    The following studies were reviewed today:  June 17, 2023 echo EF 30 to 35% RV function normal  July 01, 2023 in clinic device interrogation personally reviewed Longevity 6.6 years Lead parameter stable Atrial pacing 29% Ventricular pacing 94% AF burden less than 1% Underlying rhythm V sensed 30 to 35 bpm        Physical Exam:    VS:  BP (!) 142/80   Pulse 87   Ht 5\' 11"  (1.803 m)   Wt 290 lb 9.6 oz (131.8 kg)   SpO2 98%   BMI 40.53 kg/m     Wt Readings from Last 3 Encounters:  07/01/23 290 lb 9.6 oz (131.8 kg)  05/20/23 295 lb (133.8 kg)  04/30/22 285 lb (129.3 kg)     GEN:  Well nourished, well developed in no acute distress.  Obese CARDIAC: RRR, no murmurs, rubs, gallops.  Pacemaker pocket well-healed RESPIRATORY:  Clear to auscultation without rales, wheezing or rhonchi       ASSESSMENT:    1. Complete heart block (HCC)   2. Paroxysmal atrial fibrillation (HCC)   3. Pacemaker   4. Chronic systolic heart failure (HCC)    PLAN:    In order of problems listed above:  #Chronic systolic heart failure NYHA class  II-III.  Warm dry on exam.  EF 30 to 35% on recent echo.  I suspect the driver of his reduced ejection fraction is his RV pacing.  His paced QRS is 160 ms.  I have recommended upgrade of his pacemaker system to a CRT-P.  I discussed CRT-P versus CRT D during today's clinic appointment.  Given his advanced age, I have recommended CRT-P which would minimize the risk of complication.  Risks, benefits, alternatives to CRT upgrade were discussed in detail with the patient today. The patient understands that the risks include but are not limited to bleeding, infection, pneumothorax, perforation, tamponade, vascular damage, renal failure, MI, stroke, death, and lead dislodgement and wishes to proceed.  We will therefore schedule device implantation at the next available time.  He will hold his anticoagulant for 3 days prior to the procedure.  He will stay overnight after the procedure.  #Permanent pacemaker in situ Device functioning appropriately.  Continue remote monitoring.  #Paroxysmal atrial fibrillation Hold anticoagulation for 2 days prior to the procedure.  Low burden.      Signed, Bill Dunn, MD, [facility], [facility] 07/01/2023 10:56 AM    Electrophysiology [facility]

## 2023-07-01 ENCOUNTER — Encounter: Payer: Self-pay | Admitting: Cardiology

## 2023-07-01 ENCOUNTER — Ambulatory Visit: Payer: Medicare Other | Attending: Cardiology | Admitting: Cardiology

## 2023-07-01 VITALS — BP 142/80 | HR 87 | Ht 71.0 in | Wt 290.6 lb

## 2023-07-01 DIAGNOSIS — I442 Atrioventricular block, complete: Secondary | ICD-10-CM | POA: Insufficient documentation

## 2023-07-01 DIAGNOSIS — I480 Paroxysmal atrial fibrillation: Secondary | ICD-10-CM

## 2023-07-01 DIAGNOSIS — I5022 Chronic systolic (congestive) heart failure: Secondary | ICD-10-CM | POA: Insufficient documentation

## 2023-07-01 DIAGNOSIS — Z950 Presence of cardiac pacemaker: Secondary | ICD-10-CM | POA: Diagnosis present

## 2023-07-01 NOTE — Patient Instructions (Signed)
Medication Instructions:  Your physician recommends that you continue on your current medications as directed. Please refer to the Current Medication list given to you today.  *If you need a refill on your cardiac medications before your next appointment, please call your pharmacy*  Lab Work: BMET and CBC  Testing/Procedures: Pacemaker upgrade - see instruction letter  Follow-Up: At [facility], you and your health needs are our priority.  As part of our continuing mission to provide you with exceptional heart care, we have created designated Provider Care Teams.  These Care Teams include your primary Cardiologist (physician) and Advanced Practice Providers (APPs -  Physician Assistants and Nurse Practitioners) who all work together to provide you with the care you need, when you need it.  Your next appointment:   See instruction letter for follow up information

## 2023-07-22 LAB — CBC WITH DIFFERENTIAL/PLATELET
Basophils Absolute: 0.1 10*3/uL (ref 0.0–0.2)
Basos: 1 %
EOS (ABSOLUTE): 0.2 10*3/uL (ref 0.0–0.4)
Eos: 3 %
Hematocrit: 43.1 % (ref 37.5–51.0)
Hemoglobin: 13.8 g/dL (ref 13.0–17.7)
Immature Grans (Abs): 0 10*3/uL (ref 0.0–0.1)
Immature Granulocytes: 0 %
Lymphocytes Absolute: 2.3 10*3/uL (ref 0.7–3.1)
Lymphs: 33 %
MCH: 29.9 pg (ref 26.6–33.0)
MCHC: 32 g/dL (ref 31.5–35.7)
MCV: 94 fL (ref 79–97)
Monocytes Absolute: 0.6 10*3/uL (ref 0.1–0.9)
Monocytes: 9 %
Neutrophils Absolute: 3.8 10*3/uL (ref 1.4–7.0)
Neutrophils: 54 %
Platelets: 206 10*3/uL (ref 150–450)
RBC: 4.61 x10E6/uL (ref 4.14–5.80)
RDW: 13.2 % (ref 11.6–15.4)
WBC: 7.1 10*3/uL (ref 3.4–10.8)

## 2023-07-22 LAB — BASIC METABOLIC PANEL
BUN/Creatinine Ratio: 18 (ref 10–24)
BUN: 18 mg/dL (ref 8–27)
CO2: 21 mmol/L (ref 20–29)
Calcium: 10.1 mg/dL (ref 8.6–10.2)
Chloride: 107 mmol/L — ABNORMAL HIGH (ref 96–106)
Creatinine, Ser: 0.99 mg/dL (ref 0.76–1.27)
Glucose: 140 mg/dL — ABNORMAL HIGH (ref 70–99)
Potassium: 4 mmol/L (ref 3.5–5.2)
Sodium: 144 mmol/L (ref 134–144)
eGFR: 78 mL/min/{1.73_m2} (ref >59)

## 2023-07-31 NOTE — Pre-Procedure Instructions (Signed)
Attempted to call patient regarding procedure instructions.  Left voicemail on the following items: Arrival time 1"30 Nothing to eat or drink after midnight No meds AM of procedure Responsible person to drive you home and stay with you for 24 hrs Wash with special soap night before and morning of procedure If on anti-coagulant drug instructions Eliquis- last dose tonight

## 2023-08-03 ENCOUNTER — Ambulatory Visit (HOSPITAL_COMMUNITY)
Admission: RE | Admit: 2023-08-03 | Discharge: 2023-08-04 | Disposition: A | Discharge status: Home / Self Care | Payer: Medicare Other | Attending: Cardiology | Admitting: Cardiology

## 2023-08-03 ENCOUNTER — Encounter (HOSPITAL_COMMUNITY): Payer: Self-pay | Admitting: Cardiology

## 2023-08-03 ENCOUNTER — Other Ambulatory Visit: Payer: Self-pay

## 2023-08-03 ENCOUNTER — Encounter (HOSPITAL_COMMUNITY)
Admission: RE | Disposition: A | Discharge status: Home / Self Care | Payer: Self-pay | Source: Home / Self Care | Attending: Cardiology

## 2023-08-03 DIAGNOSIS — I442 Atrioventricular block, complete: Secondary | ICD-10-CM

## 2023-08-03 DIAGNOSIS — I5022 Chronic systolic (congestive) heart failure: Secondary | ICD-10-CM

## 2023-08-03 DIAGNOSIS — Z7901 Long term (current) use of anticoagulants: Secondary | ICD-10-CM | POA: Diagnosis not present

## 2023-08-03 DIAGNOSIS — I480 Paroxysmal atrial fibrillation: Secondary | ICD-10-CM | POA: Insufficient documentation

## 2023-08-03 DIAGNOSIS — Z45010 Encounter for checking and testing of cardiac pacemaker pulse generator [battery]: Secondary | ICD-10-CM | POA: Diagnosis not present

## 2023-08-03 DIAGNOSIS — Z950 Presence of cardiac pacemaker: Secondary | ICD-10-CM | POA: Diagnosis present

## 2023-08-03 HISTORY — PX: BIV UPGRADE: EP1202

## 2023-08-03 SURGERY — BIV UPGRADE

## 2023-08-03 MED ORDER — LOSARTAN POTASSIUM 50 MG PO TABS
100.0000 mg | ORAL_TABLET | Freq: Every day | ORAL | Status: DC
  Administered 2023-08-04: 100 mg via ORAL
  Filled 2023-08-03: qty 2

## 2023-08-03 MED ORDER — ONDANSETRON HCL 4 MG/2ML IJ SOLN: 4.0000 mg | Freq: Four times a day (QID) | INTRAMUSCULAR | Status: DC | PRN

## 2023-08-03 MED ORDER — MIDAZOLAM HCL 5 MG/5ML IJ SOLN
INTRAMUSCULAR | Status: DC | PRN
  Administered 2023-08-03 (×3): 1 mg via INTRAVENOUS

## 2023-08-03 MED ORDER — MIDAZOLAM HCL 5 MG/5ML IJ SOLN
INTRAMUSCULAR | Status: AC
  Filled 2023-08-03: qty 5

## 2023-08-03 MED ORDER — POVIDONE-IODINE 10 % EX SWAB
2.0000 | Freq: Once | CUTANEOUS | Status: AC
  Administered 2023-08-03: 2 via TOPICAL

## 2023-08-03 MED ORDER — HEPARIN (PORCINE) IN NACL 1000-0.9 UT/500ML-% IV SOLN
INTRAVENOUS | Status: DC | PRN
  Administered 2023-08-03: 500 mL

## 2023-08-03 MED ORDER — CEFAZOLIN IN SODIUM CHLORIDE 3-0.9 GM/100ML-% IV SOLN
3.0000 g | INTRAVENOUS | Status: AC
  Administered 2023-08-03: 3 g via INTRAVENOUS
  Filled 2023-08-03: qty 100

## 2023-08-03 MED ORDER — ACETAMINOPHEN 325 MG PO TABS
325.0000 mg | ORAL_TABLET | ORAL | Status: DC | PRN
  Administered 2023-08-03 – 2023-08-04 (×2): 650 mg via ORAL
  Filled 2023-08-03 (×2): qty 2

## 2023-08-03 MED ORDER — IOHEXOL 350 MG/ML SOLN
INTRAVENOUS | Status: DC | PRN
  Administered 2023-08-03: 10 mL

## 2023-08-03 MED ORDER — FENTANYL CITRATE (PF) 100 MCG/2ML IJ SOLN
INTRAMUSCULAR | Status: AC
  Filled 2023-08-03: qty 2

## 2023-08-03 MED ORDER — BUPIVACAINE HCL (PF) 0.25 % IJ SOLN
INTRAMUSCULAR | Status: DC | PRN
  Administered 2023-08-03: 60 mL

## 2023-08-03 MED ORDER — BUPIVACAINE HCL (PF) 0.25 % IJ SOLN
INTRAMUSCULAR | Status: AC
  Filled 2023-08-03: qty 30

## 2023-08-03 MED ORDER — SODIUM CHLORIDE 0.9 % IV SOLN
80.0000 mg | INTRAVENOUS | Status: AC
  Administered 2023-08-03: 80 mg

## 2023-08-03 MED ORDER — SODIUM CHLORIDE 0.9 % IV SOLN
INTRAVENOUS | Status: AC
  Filled 2023-08-03: qty 2

## 2023-08-03 MED ORDER — CEFAZOLIN SODIUM-DEXTROSE 2-4 GM/100ML-% IV SOLN
INTRAVENOUS | Status: AC
  Filled 2023-08-03: qty 100

## 2023-08-03 MED ORDER — FENTANYL CITRATE (PF) 100 MCG/2ML IJ SOLN
INTRAMUSCULAR | Status: DC | PRN
  Administered 2023-08-03 (×4): 25 ug via INTRAVENOUS

## 2023-08-03 MED ORDER — ATORVASTATIN CALCIUM 10 MG PO TABS
20.0000 mg | ORAL_TABLET | Freq: Every day | ORAL | Status: DC
  Administered 2023-08-03: 20 mg via ORAL
  Filled 2023-08-03: qty 2

## 2023-08-03 MED ORDER — SODIUM CHLORIDE 0.9 % IV SOLN: INTRAVENOUS | Status: DC

## 2023-08-03 SURGICAL SUPPLY — 27 items
BALLN COR SINUS VENO 6FR 80 (BALLOONS) ×1
BALLOON COR SINUS VENO 6FR 80 (BALLOONS) IMPLANT
CABLE SURGICAL S-101-97-12 (CABLE) ×1 IMPLANT
CATH ATTAIN SEL SURV 6248V-130 (CATHETERS) IMPLANT
CATH CPS DIRECT 135 DS2C020 (CATHETERS) IMPLANT
CATH CPS LOCATOR 3D MED (CATHETERS) IMPLANT
COVER DOME SNAP 22 D (MISCELLANEOUS) IMPLANT
HELIX LOCKING TOOL (MISCELLANEOUS) ×1
KIT ESSENTIALS PG (KITS) IMPLANT
LEAD ATTAIN PERFORM ST 4398-88 (Lead) IMPLANT
LEAD QUARTET 1456Q-86 (Lead) IMPLANT
LEAD ULTIPACE 65 LPA1231/65 (Lead) IMPLANT
PACEMAKER ALLR CRT-P RF PM3222 (Pacemaker) IMPLANT
PAD DEFIB RADIO PHYSIO CONN (PAD) ×1 IMPLANT
POUCH AIGIS-R ANTIBACT PPM (Mesh General) ×1 IMPLANT
POUCH AIGIS-R ANTIBACT PPM MED (Mesh General) IMPLANT
PPM ALLURE CRT-P RF PM3222 (Pacemaker) ×1 IMPLANT
QUARTET 1456Q-86 (Lead) ×1 IMPLANT
SHEATH 7FR PRELUDE SNAP 13 (SHEATH) IMPLANT
SHEATH 8FR PRELUDE SNAP 13 (SHEATH) IMPLANT
SHEATH 9.5FR PRELUDE SNAP 13 (SHEATH) IMPLANT
SLITTER AGILIS HISPRO (INSTRUMENTS) IMPLANT
TOOL HELIX LOCKING (MISCELLANEOUS) IMPLANT
TRAY PACEMAKER INSERTION (PACKS) ×1 IMPLANT
WIRE ACUITY WHISPER EDS 4648 (WIRE) IMPLANT
WIRE HI TORQ VERSACORE-J 145CM (WIRE) IMPLANT
WIRE MAILMAN 182CM (WIRE) IMPLANT

## 2023-08-03 NOTE — H&P (Signed)
Electrophysiology Office Follow up Visit Note:     Date:  08/03/2023    ID:  Bill Young, DOB [birth year], MRN [id number]   PCP:  Jerl Mina, MD      [facility] Cardiologist:  None  [facility] Electrophysiologist:  Lanier Prude, MD      Interval History:     Bill Young is a 77 y.o. male who presents for a follow up visit.    Last seen June 2023 by me and May 20, 2023 by Levy Sjogren.  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  He has a dual-chamber permanent pacemaker this implanted in March 2021.  Has a high pacing burden.    Presents for CRT upgrade.     Objective Past medical, surgical, social and family history were reviewed.   ROS:   Please see the history of present illness.    All other systems reviewed and are negative.   EKGs/Labs/Other Studies Reviewed:     The following studies were reviewed today:   June 17, 2023 echo EF 30 to 35% RV function normal   July 01, 2023 in clinic device interrogation personally reviewed Longevity 6.6 years Lead parameter stable Atrial pacing 29% Ventricular pacing 94% AF burden less than 1% Underlying rhythm V sensed 30 to 35 bpm           Physical Exam:     VS:  BP 137/82   Pulse 86   Ht 5\' 11"  (1.803 m)   Wt 290 lb 9.6 oz (131.8 kg)   SpO2 98%   BMI 40.53 kg/m         Wt Readings from Last 3 Encounters:  07/01/23 290 lb 9.6 oz (131.8 kg)  05/20/23 295 lb (133.8 kg)  04/30/22 285 lb (129.3 kg)      GEN:  Well nourished, well developed in no acute distress.  Obese CARDIAC: RRR, no murmurs, rubs, gallops.  Pacemaker pocket well-healed RESPIRATORY:  Clear to auscultation without rales, wheezing or rhonchi          Assessment ASSESSMENT:     1. Complete heart block (HCC)   2. Paroxysmal atrial fibrillation (HCC)   3. Pacemaker   4. Chronic systolic heart failure (HCC)     PLAN:     In  order of problems listed above:   #Chronic systolic heart failure NYHA class II-III.  Warm dry on exam.  EF 30 to 35% on recent echo.  I suspect the driver of his reduced ejection fraction is his RV pacing.  His paced QRS is 160 ms.   I have recommended upgrade of his pacemaker system to a CRT-P.  I discussed CRT-P versus CRT D during today's clinic appointment.  Given his advanced age, I have recommended CRT-P which would minimize the risk of complication.   Risks, benefits, alternatives to CRT upgrade were discussed in detail with the patient today. The patient understands that the risks include but are not limited to bleeding, infection, pneumothorax, perforation, tamponade, vascular damage, renal failure, MI, stroke, death, and lead dislodgement and wishes to proceed.  We will therefore schedule device implantation at the next available time.   He will hold his anticoagulant for 3 days prior to the procedure.   He will stay overnight after the procedure.   #Permanent pacemaker in situ Device functioning appropriately.  Continue remote monitoring.   #Paroxysmal atrial fibrillation Hold anticoagulation for 2 days prior to  the procedure.  Low burden.     Presents for CRT upgrade. Procedure reviewed.       Signed, Steffanie Dunn, MD, [facility], [facility] 08/03/2023 Electrophysiology [facility]

## 2023-08-03 NOTE — Discharge Summary (Signed)
ELECTROPHYSIOLOGY PROCEDURE DISCHARGE SUMMARY    Patient ID: Bill Young,  MRN: [id number], DOB/AGE: 1945/11/27 77 y.o.  Admit date: 08/03/2023 Discharge date: ***  Primary Care Physician: Bill Mina, MD  Primary Cardiologist: None  Electrophysiologist: Dr. Lalla Young   Primary Discharge Diagnosis:  CHB Pacing induced LBBB Chronic systolic CHF status post CRT-pacemaker implantation this admission  Secondary Discharge Diagnosis:  Paroxysmal AF  Allergies  Allergen Reactions   Epinephrine Shortness Of Breath   Lidocaine Other (See Comments)    Chills and generalized diaphoresis  EPI mixed with lidocaine to stop bleeding     Procedures This Admission:  1.  Upgrade of an exisiting Abbott Dual chamber PPM to a CRT-P on 9/23 by Dr. Lalla Young. The patient received a {ABBOTTGENERATORS:27728} with pre-exisiting right atrial lead and  right ventricular lead, and a {ABBOTTLEADS:27723} left ventricular lead.  {Blank single:19197::"There were no immediate post procedure complications.","Post-procedural complications included ***"}   2.  CXR on {Blank single:19197::"08/02/2023","08/03/2023"} demonstrated no pneumothorax status post device implantation.       Brief HPI: Bill Young is a 77 y.o. male was  followed in the outpatient setting for management of his device.   He was noted to have a decrease in his EF in the setting of frequent RV pacing.  Past medical history includes above.  The patient has complete heart block and concerns for pacing induced systolic dysfunction. Risks, benefits, and alternatives to PPM upgrade were reviewed with the patient who wished to proceed.   Hospital Course:  The patient was admitted and underwent upgrade to a Abbott CRT-P with details as outlined above.  {He/she (caps):30048} was monitored on telemetry overnight which demonstrated {Blank single:19197::"appropriate pacing","NSR","atrial fibrillation"}.  Left chest was without  hematoma or ecchymosis.  The device was interrogated and found to be functioning normally.  CXR was obtained and demonstrated {Blank single:19197::"***","no pneumothorax status post device implantation"}.  Wound care, arm mobility, and restrictions were reviewed with the patient.  The patient was examined and considered stable for discharge to home.    Anticoagulation resumption {OACRESUME:27718::"This patient is not on anticoagulation"} {Blank single:19197::"Tuesday August 04, 2023","Wednesday August 05, 2023","Thursday August 06, 2023","Friday August 07, 2023"}    Physical Exam: Vitals:   08/03/23 1225  BP: 137/82  Pulse: 86  Resp: 16  Temp: 98.2 F (36.8 C)  TempSrc: Axillary  SpO2: 97%  Weight: 129.3 kg  Height: 5\' 11"  (1.803 m)    GEN- NAD. A&O x 3.  HEENT: Normocephalic, atraumatic Lungs- CTAB, Normal effort.  Heart- RRR, No M/G/R.  GI- Soft, NT, ND.  Extremities- No clubbing, cyanosis, or edema;  Skin- warm and dry, no rash or lesion, left chest without hematoma/ecchymosis  Discharge Medications:  Allergies as of 08/03/2023       Reactions   Epinephrine Shortness Of Breath   Lidocaine Other (See Comments)   Chills and generalized diaphoresis  EPI mixed with lidocaine to stop bleeding     Med Rec must be completed prior to using this SMARTLINK***       Disposition:  Home with usual follow up as in AVS   Duration of Discharge Encounter: Greater than 30 minutes including physician time.  Bill Flock, PA-C  08/03/2023 3:37 PM

## 2023-08-03 NOTE — Plan of Care (Signed)

## 2023-08-04 ENCOUNTER — Ambulatory Visit (HOSPITAL_COMMUNITY): Payer: Medicare Other

## 2023-08-04 ENCOUNTER — Encounter (HOSPITAL_COMMUNITY): Payer: Self-pay | Admitting: Cardiology

## 2023-08-04 DIAGNOSIS — I5022 Chronic systolic (congestive) heart failure: Secondary | ICD-10-CM | POA: Diagnosis not present

## 2023-08-04 DIAGNOSIS — I442 Atrioventricular block, complete: Secondary | ICD-10-CM | POA: Diagnosis not present

## 2023-08-04 DIAGNOSIS — I480 Paroxysmal atrial fibrillation: Secondary | ICD-10-CM | POA: Diagnosis not present

## 2023-08-04 DIAGNOSIS — Z7901 Long term (current) use of anticoagulants: Secondary | ICD-10-CM | POA: Diagnosis not present

## 2023-08-04 DIAGNOSIS — Z950 Presence of cardiac pacemaker: Secondary | ICD-10-CM | POA: Diagnosis not present

## 2023-08-04 MED ORDER — APIXABAN 5 MG PO TABS: 5.0000 mg | ORAL_TABLET | Freq: Two times a day (BID) | ORAL | Status: AC

## 2023-08-04 NOTE — TOC Transition Note (Signed)
Transition of Care [facility]) - CM/SW Discharge Note   Patient Details  Name: Bill Young MRN: [id number] Date of Birth: 10-02-1946  [facility]) CM/SW Contact:  Leone Haven, RN Phone Number: 08/04/2023, 10:04 AM   Clinical Narrative:    For dc today, has no needs. Son to transport home.   Final next level of care: Home/Self Care Barriers to Discharge: No Barriers Identified   Patient Goals and CMS Choice   Choice offered to / list presented to : NA  Discharge Placement                         Discharge Plan and Services Additional resources added to the After Visit Summary for   In-house Referral: NA Discharge Planning Services: CM Consult Post Acute Care Choice: NA          DME Arranged: N/A DME Agency: NA       HH Arranged: NA          Social Determinants of Health (SDOH) Interventions SDOH Screenings   Food Insecurity: No Food Insecurity (01/29/2023)   Received from [facility], [facility]  Transportation Needs: No Transportation Needs (01/29/2023)   Received from [facility], [facility]  Utilities: Not At Risk (01/29/2023)   Received from [facility], [facility]  Financial Resource Strain: Low Risk  (01/29/2023)   Received from [facility], [facility]  Tobacco Use: Medium Risk (07/01/2023)     Readmission Risk Interventions     No data to display

## 2023-08-04 NOTE — Plan of Care (Signed)
  Problem: Education: Goal: Knowledge of cardiac device and self-care will improve Outcome: Completed/Met Goal: Ability to safely manage health related needs after discharge will improve Outcome: Completed/Met Goal: Individualized Educational Video(s) Outcome: Completed/Met   Problem: Cardiac: Goal: Ability to achieve and maintain adequate cardiopulmonary perfusion will improve Outcome: Completed/Met   Problem: Education: Goal: Knowledge of General Education information will improve Description: Including pain rating scale, medication(s)/side effects and non-pharmacologic comfort measures Outcome: Completed/Met   Problem: Health Behavior/Discharge Planning: Goal: Ability to manage health-related needs will improve Outcome: Completed/Met   Problem: Clinical Measurements: Goal: Ability to maintain clinical measurements within normal limits will improve Outcome: Completed/Met Goal: Will remain free from infection Outcome: Completed/Met Goal: Diagnostic test results will improve Outcome: Completed/Met Goal: Respiratory complications will improve Outcome: Completed/Met Goal: Cardiovascular complication will be avoided Outcome: Completed/Met   Problem: Activity: Goal: Risk for activity intolerance will decrease Outcome: Completed/Met   Problem: Nutrition: Goal: Adequate nutrition will be maintained Outcome: Completed/Met   Problem: Coping: Goal: Level of anxiety will decrease Outcome: Completed/Met   Problem: Elimination: Goal: Will not experience complications related to bowel motility Outcome: Completed/Met Goal: Will not experience complications related to urinary retention Outcome: Completed/Met   Problem: Pain Managment: Goal: General experience of comfort will improve Outcome: Completed/Met   Problem: Safety: Goal: Ability to remain free from injury will improve Outcome: Completed/Met   Problem: Skin Integrity: Goal: Risk for impaired skin integrity will  decrease Outcome: Completed/Met

## 2023-08-04 NOTE — Discharge Instructions (Addendum)
After Your Pacemaker   You have a Abbott Pacemaker  ACTIVITY Do not lift your arm above shoulder height for 1 week after your procedure. After 7 days, you may progress as below.  You should remove your sling 24 hours after your procedure, unless otherwise instructed by your provider.     Tuesday August 11, 2023  Wednesday August 12, 2023 Thursday August 13, 2023 Friday August 14, 2023   Do not lift, push, pull, or carry anything over 10 pounds with the affected arm until 6 weeks (Tuesday September 15, 2023 ) after your procedure.   You may drive AFTER your wound check, unless you have been told otherwise by your provider.   Ask your healthcare provider when you can go back to work   INCISION/Dressing Resume your Eliquis on Sunday, 9/29  If large square, outer bandage is left in place, this can be removed after 24 hours from your procedure. Do not remove steri-strips or glue as below.   If a PRESSURE DRESSING (a bulky dressing that usually goes up over your shoulder) was applied or left in place, please follow instructions given by your provider on when to return to have this removed.   Monitor your Pacemaker site for redness, swelling, and drainage. Call the device clinic at [phone] if you experience these symptoms or fever/chills.  If your incision is sealed with Steri-strips or staples, you may shower 7 days after your procedure or when told by your provider. Do not remove the steri-strips or let the shower hit directly on your site. You may wash around your site with soap and water.    If you were discharged in a sling, please do not wear this during the day more than 48 hours after your surgery unless otherwise instructed. This may increase the risk of stiffness and soreness in your shoulder.   Avoid lotions, ointments, or perfumes over your incision until it is well-healed.  You may use a hot tub or a pool AFTER your wound check appointment if the incision is completely  closed.  Pacemaker Alerts:  Some alerts are vibratory and others beep. These are NOT emergencies. Please call our office to let us know. If this occurs at night or on weekends, it can wait until the next business day. Send a remote transmission.  If your device is capable of reading fluid status (for heart failure), you will be offered monthly monitoring to review this with you.   DEVICE MANAGEMENT Remote monitoring is used to monitor your pacemaker from home. This monitoring is scheduled every 91 days by our office. It allows [place] to keep an eye on the functioning of your device to ensure it is working properly. You will routinely see your Electrophysiologist annually (more often if necessary).   You should receive your ID card for your new device in 4-8 weeks. Keep this card with you at all times once received. Consider wearing a medical alert bracelet or necklace.  Your Pacemaker may be MRI compatible. This will be discussed at your next office visit/wound check.  You should avoid contact with strong electric or magnetic fields.   Do not use amateur (ham) radio equipment or electric (arc) welding torches. MP3 player headphones with magnets should not be used. Some devices are safe to use if held at least 12 inches (30 cm) from your Pacemaker. These include power tools, lawn mowers, and speakers. If you are unsure if something is safe to use, ask your health care provider.  When  using your cell phone, hold it to the ear that is on the opposite side from the Pacemaker. Do not leave your cell phone in a pocket over the Pacemaker.  You may safely use electric blankets, heating pads, computers, and microwave ovens.  Call the office right away if: You have chest pain. You feel more short of breath than you have felt before. You feel more light-headed than you have felt before. Your incision starts to open up.  This information is not intended to replace advice given to you by your health care  provider. Make sure you discuss any questions you have with your health care provider.

## 2023-08-04 NOTE — Progress Notes (Signed)
Discharge instructions reviewed with pt.  Copy of instructions given to pt. No new scripts. Pt aware to start eliquis back on 9/29. Pt d/c'd via wheelchair with belongings, to discharge lounge while he waits for his son, son should be here next 30 minutes approx.           Escorted by staff.   Nikita Humble,RN SWOT

## 2023-08-04 NOTE — TOC Initial Note (Signed)
Transition of Care [facility]) - Initial/Assessment Note    Patient Details  Name: Bill Young MRN: [id number] Date of Birth: 1946-11-01  Transition of Care [facility]) CM/SW Contact:    Leone Haven, RN Phone Number: 08/04/2023, 10:03 AM  Clinical Narrative:                 From home with spouse, has PCP and insurance on file, states has no HH services in place at this time, he has a walker at home..  States son will transport him home at [employer] and family is support system, states gets medications from [place] in [place] and [place] in [place].  Pta ambulatory  with walker.   Expected Discharge Plan: Home/Self Care Barriers to Discharge: No Barriers Identified   Patient Goals and CMS Choice Patient states their goals for this hospitalization and ongoing recovery are:: return home   Choice offered to / list presented to : NA      Expected Discharge Plan and Services In-house Referral: NA Discharge Planning Services: CM Consult Post Acute Care Choice: NA Living arrangements for the past 2 months: Single Family Home Expected Discharge Date: 08/04/23               DME Arranged: N/A DME Agency: NA       HH Arranged: NA          Prior Living Arrangements/Services Living arrangements for the past 2 months: Single Family Home Lives with:: Spouse Patient language and need for interpreter reviewed:: Yes Do you feel safe going back to the place where you live?: Yes      Need for Family Participation in Patient Care: Yes (Comment) Care giver support system in place?: Yes (comment) Current home services: DME (walker) Criminal Activity/Legal Involvement Pertinent to Current Situation/Hospitalization: No - Comment as needed  Activities of Daily Living      Permission Sought/Granted Permission sought to share information with : Case Manager Permission granted to share information with : Yes, Verbal Permission Granted              Emotional Assessment    Attitude/Demeanor/Rapport: Engaged Affect (typically observed): Appropriate Orientation: : Oriented to Self, Oriented to Place, Oriented to  Time, Oriented to Situation Alcohol / Substance Use: Not Applicable Psych Involvement: No (comment)  Admission diagnosis:  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 [Z95.0] Patient Active Problem List   Diagnosis Date Noted   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 08/03/2023   Persistent atrial fibrillation (HCC) 09/27/2020   Paroxysmal atrial fibrillation (HCC) 09/06/2020   Secondary hypercoagulable state (HCC) 09/06/2020   Pacemaker 05/09/2020   Complete heart block (HCC) 02/04/2020   Third degree heart block (HCC) 02/03/2020   Hypotension 02/03/2020   Hyperlipidemia 02/03/2020   PCP:  Jerl Mina, MD Pharmacy:   [facility] [address], [place] - [address] [place] [address] Phone: [phone] Fax: [phone]  [facility] - [place], [place] - [address] [place] [address] Phone: [phone] Fax: [phone]     Social Determinants of Health (SDOH) Social History: SDOH Screenings   Food Insecurity: No Food Insecurity (01/29/2023)   Received from [facility], [facility]  Transportation Needs: No Transportation Needs (01/29/2023)   Received from [facility], [facility]  Utilities: Not At Risk (01/29/2023)   Received from [facility], [facility]  Financial Resource Strain: Low Risk  (01/29/2023)   Received from [facility], [facility]  Tobacco Use: Medium Risk (07/01/2023)   SDOH Interventions:     Readmission Risk Interventions     No data to display

## 2023-08-04 NOTE — Plan of Care (Signed)
Problem: Cardiac: Goal: Ability to achieve and maintain adequate cardiopulmonary perfusion will improve Outcome: Progressing   Problem: Education: Goal: Knowledge of General Education information will improve Description: Including pain rating scale, medication(s)/side effects and non-pharmacologic comfort measures Outcome: Progressing

## 2023-08-05 LAB — CUP PACEART REMOTE DEVICE CHECK
Battery Remaining Longevity: 85 mo
Battery Remaining Percentage: 95.5 %
Battery Voltage: 2.99 V
Brady Statistic AP VP Percent: 91 %
Brady Statistic AP VS Percent: 1.6 %
Brady Statistic AS VP Percent: 6.7 %
Brady Statistic AS VS Percent: 1 %
Brady Statistic RA Percent Paced: 92 %
Date Time Interrogation Session: 20240925082225
Implantable Pulse Generator Implant Date: 20210327
Lead Channel Impedance Value: 360 Ohm
Lead Channel Impedance Value: 460 Ohm
Lead Channel Impedance Value: 600 Ohm
Lead Channel Pacing Threshold Amplitude: 0.375 V
Lead Channel Pacing Threshold Amplitude: 0.625 V
Lead Channel Pacing Threshold Amplitude: 0.875 V
Lead Channel Pacing Threshold Pulse Width: 0.5 ms
Lead Channel Pacing Threshold Pulse Width: 0.5 ms
Lead Channel Pacing Threshold Pulse Width: 0.5 ms
Lead Channel Sensing Intrinsic Amplitude: 0.9 mV
Lead Channel Setting Pacing Amplitude: 1.625
Lead Channel Setting Pacing Amplitude: 2 V
Lead Channel Setting Pacing Amplitude: 2 V
Lead Channel Setting Pacing Pulse Width: 0.5 ms
Lead Channel Setting Pacing Pulse Width: 0.5 ms
Lead Channel Setting Sensing Sensitivity: 4 mV
Pulse Gen Model: 2272
Pulse Gen Serial Number: 8178835

## 2023-08-19 ENCOUNTER — Ambulatory Visit: Payer: Medicare Other | Attending: Cardiology

## 2023-08-19 DIAGNOSIS — I442 Atrioventricular block, complete: Secondary | ICD-10-CM | POA: Insufficient documentation

## 2023-08-19 LAB — CUP PACEART INCLINIC DEVICE CHECK
Date Time Interrogation Session: 20241009204358
Implantable Lead Connection Status: 753985
Implantable Lead Connection Status: 753985
Implantable Lead Connection Status: 753985
Implantable Lead Implant Date: 20210327
Implantable Lead Implant Date: 20210327
Implantable Lead Implant Date: 20240923
Implantable Lead Location: 753858
Implantable Lead Location: 753859
Implantable Lead Location: 753860
Implantable Pulse Generator Implant Date: 20240923
Pulse Gen Model: 3562
Pulse Gen Serial Number: 8189738

## 2023-08-19 NOTE — Patient Instructions (Signed)
   After Your Pacemaker   Monitor your pacemaker site for redness, swelling, and drainage. Call the device clinic at [phone] if you experience these symptoms or fever/chills.  Your incision was closed with Steri-strips or staples:  You may shower 7 days after your procedure and wash your incision with soap and water. Avoid lotions, ointments, or perfumes over your incision until it is well-healed.  You may use a hot tub or a pool after your wound check appointment if the incision is completely closed.  Do not lift, push or pull greater than 10 pounds with the affected arm until 6 weeks after your procedure. UNTIL AFTER NOVEMBER 4TH. There are no other restrictions in arm movement after your wound check appointment.  You may drive, unless driving has been restricted by your healthcare providers.   Remote monitoring is used to monitor your pacemaker from home. This monitoring is scheduled every 91 days by our office. It allows [place] to keep an eye on the functioning of your device to ensure it is working properly. You will routinely see your Electrophysiologist annually (more often if necessary).

## 2023-08-19 NOTE — Progress Notes (Signed)
Wound check appointment. Steri-strips removed. Wound without redness or edema. Incision edges approximated, wound well healed. Normal device function. BIVP 96% Thresholds, sensing, and impedances consistent with implant measurements. Device programmed at 3.5V/auto capture programmed on for extra safety margin until 3 month visit. Histogram distribution appropriate for patient and level of activity. No mode switches or high ventricular rates noted. Patient educated about wound care, arm mobility, lifting restrictions. ROV in 3 months with implanting physician.

## 2023-11-05 ENCOUNTER — Ambulatory Visit (INDEPENDENT_AMBULATORY_CARE_PROVIDER_SITE_OTHER): Payer: Medicare Other

## 2023-11-05 DIAGNOSIS — I442 Atrioventricular block, complete: Secondary | ICD-10-CM | POA: Diagnosis not present

## 2023-11-06 LAB — CUP PACEART REMOTE DEVICE CHECK
Battery Remaining Longevity: 85 mo
Battery Remaining Percentage: 95.5 %
Battery Voltage: 3.01 V
Brady Statistic AP VP Percent: 79 %
Brady Statistic AP VS Percent: 2.5 %
Brady Statistic AS VP Percent: 17 %
Brady Statistic AS VS Percent: 1 %
Brady Statistic RA Percent Paced: 80 %
Date Time Interrogation Session: 20241226040014
Implantable Lead Connection Status: 753985
Implantable Lead Connection Status: 753985
Implantable Lead Connection Status: 753985
Implantable Lead Implant Date: 20210327
Implantable Lead Implant Date: 20210327
Implantable Lead Implant Date: 20240923
Implantable Lead Location: 753858
Implantable Lead Location: 753859
Implantable Lead Location: 753860
Implantable Pulse Generator Implant Date: 20240923
Lead Channel Impedance Value: 380 Ohm
Lead Channel Impedance Value: 510 Ohm
Lead Channel Impedance Value: 530 Ohm
Lead Channel Pacing Threshold Amplitude: 0.625 V
Lead Channel Pacing Threshold Amplitude: 0.625 V
Lead Channel Pacing Threshold Amplitude: 1 V
Lead Channel Pacing Threshold Pulse Width: 0.5 ms
Lead Channel Pacing Threshold Pulse Width: 0.5 ms
Lead Channel Pacing Threshold Pulse Width: 0.5 ms
Lead Channel Sensing Intrinsic Amplitude: 1.4 mV
Lead Channel Sensing Intrinsic Amplitude: 10.8 mV
Lead Channel Setting Pacing Amplitude: 1.625
Lead Channel Setting Pacing Amplitude: 2 V
Lead Channel Setting Pacing Amplitude: 2 V
Lead Channel Setting Pacing Pulse Width: 0.5 ms
Lead Channel Setting Pacing Pulse Width: 0.5 ms
Lead Channel Setting Sensing Sensitivity: 4 mV
Pulse Gen Model: 3562
Pulse Gen Serial Number: 8189738

## 2023-11-17 NOTE — Progress Notes (Signed)
  Electrophysiology Office Follow up Visit Note:    Date:  11/18/2023   ID:  Bill Young, DOB [birth year], MRN [id number]  PCP:  Valora Agent, MD  [facility] Cardiologist:  None  [facility] Electrophysiologist:  OLE ONEIDA HOLTS, MD    Interval History:     Bill Young is a 78 y.o. male who presents for a follow up visit.  On August 03, 2023, the patient underwent successful upgrade of his dual-chamber permanent pacemaker to a CRT-P using a left bundle area lead. His post upgrade EKG demonstrated a QRS duration of 124 ms. Remote interrogations have shown stable device function after upgrade.  He is doing well today.  He reports a significant improvement in his symptoms following upgrade of his device.  He is now able to do work around the yard and come up a ramp at his house which he was previously not able to do.  [facility]       Past medical, surgical, social and family history were reviewed.  ROS:   Please see the history of present illness.    All other systems reviewed and are negative.  EKGs/Labs/Other Studies Reviewed:    The following studies were reviewed today:  June 17, 2023 echo showed an EF of 30 to 35%, normal RV function   November 18, 2023 in clinic device interrogation personally reviewed Battery Ingevity 7.5 years Lead parameter stable 78% atrial pacing 95% BiV pacing Base rate is 70 to suppress PVCs PMT present, however the recognizes and appropriately treats  EKG Interpretation Date/Time:  Wednesday November 18 2023 09:41:05 EST Ventricular Rate:  89 PR Interval:  216 QRS Duration:  118 QT Interval:  402 QTC Calculation: 489 R Axis:   50  Text Interpretation: Atrial-sensed ventricular-paced rhythm with prolonged AV conduction with occasional Premature ventricular complexes Confirmed by Holts Ole [phone]) on 11/18/2023 9:56:39 AM     Physical Exam:    VS:  BP (!) 152/82 (BP Location: Left Arm, Patient Position: Sitting, Cuff Size: Large)   Pulse 89   Ht 5' 11 (1.803 m)   Wt 297 lb 3.2 oz (134.8 kg)   SpO2 98%   BMI 41.45 kg/m     Wt Readings from Last 3 Encounters:  11/18/23 297 lb 3.2 oz (134.8 kg)  08/04/23 290 lb (131.5 kg)  07/01/23 290 lb 9.6 oz (131.8 kg)     GEN: no distress.  Obese CARD: RRR, No MRG.  CIED pocket well-healed RESP: No IWOB. CTAB.      ASSESSMENT:    1. Complete heart block (HCC)   2. Chronic systolic heart failure (HCC)   3. Pacemaker    PLAN:    In order of problems listed above:  #Complete heart block #Permanent pacemaker in situ #CRT-P in situ NYHA class II.  Warm and dry on exam.  Last EF was 30 to 35% prior to upgrade of his device to one including a left bundle area lead. Continue GDMT  Repeat echocardiogram  Device functioning appropriately.  Continue remote monitoring.  Patient to establish with general cardiology for routine heart failure management, referral made Follow-up with EP APP in 1 year   Signed, Ole Holts, MD, [facility], [facility] 11/18/2023 10:04 AM    Electrophysiology [facility]

## 2023-11-18 ENCOUNTER — Encounter: Payer: Self-pay | Admitting: Cardiology

## 2023-11-18 ENCOUNTER — Ambulatory Visit: Payer: Medicare Other | Attending: Cardiology | Admitting: Cardiology

## 2023-11-18 VITALS — BP 152/82 | HR 89 | Ht 71.0 in | Wt 297.2 lb

## 2023-11-18 DIAGNOSIS — Z950 Presence of cardiac pacemaker: Secondary | ICD-10-CM | POA: Diagnosis present

## 2023-11-18 DIAGNOSIS — I5022 Chronic systolic (congestive) heart failure: Secondary | ICD-10-CM | POA: Diagnosis present

## 2023-11-18 DIAGNOSIS — I442 Atrioventricular block, complete: Secondary | ICD-10-CM | POA: Insufficient documentation

## 2023-11-18 LAB — CUP PACEART INCLINIC DEVICE CHECK
Battery Remaining Longevity: 85 mo
Battery Voltage: 3.01 V
Brady Statistic RA Percent Paced: 78 %
Brady Statistic RV Percent Paced: 95 %
Date Time Interrogation Session: 20250108095730
Implantable Lead Connection Status: 753985
Implantable Lead Connection Status: 753985
Implantable Lead Connection Status: 753985
Implantable Lead Implant Date: 20210327
Implantable Lead Implant Date: 20210327
Implantable Lead Implant Date: 20240923
Implantable Lead Location: 753858
Implantable Lead Location: 753859
Implantable Lead Location: 753860
Implantable Pulse Generator Implant Date: 20240923
Lead Channel Impedance Value: 387.5 Ohm
Lead Channel Impedance Value: 537.5 Ohm
Lead Channel Impedance Value: 587.5 Ohm
Lead Channel Pacing Threshold Amplitude: 0.75 V
Lead Channel Pacing Threshold Amplitude: 0.75 V
Lead Channel Pacing Threshold Amplitude: 0.75 V
Lead Channel Pacing Threshold Amplitude: 0.75 V
Lead Channel Pacing Threshold Amplitude: 1 V
Lead Channel Pacing Threshold Amplitude: 1 V
Lead Channel Pacing Threshold Pulse Width: 0.5 ms
Lead Channel Pacing Threshold Pulse Width: 0.5 ms
Lead Channel Pacing Threshold Pulse Width: 0.5 ms
Lead Channel Pacing Threshold Pulse Width: 0.5 ms
Lead Channel Pacing Threshold Pulse Width: 0.5 ms
Lead Channel Pacing Threshold Pulse Width: 0.5 ms
Lead Channel Sensing Intrinsic Amplitude: 0.9 mV
Lead Channel Sensing Intrinsic Amplitude: 9.1 mV
Lead Channel Setting Pacing Amplitude: 1.625
Lead Channel Setting Pacing Amplitude: 2 V
Lead Channel Setting Pacing Amplitude: 2 V
Lead Channel Setting Pacing Pulse Width: 0.5 ms
Lead Channel Setting Pacing Pulse Width: 0.5 ms
Lead Channel Setting Sensing Sensitivity: 4 mV
Pulse Gen Model: 3562
Pulse Gen Serial Number: 8195471

## 2023-11-18 LAB — PACEMAKER DEVICE OBSERVATION

## 2023-11-18 NOTE — Patient Instructions (Addendum)
 Medication Instructions:  Your physician recommends that you continue on your current medications as directed. Please refer to the Current Medication list given to you today.  *If you need a refill on your cardiac medications before your next appointment, please call your pharmacy*  Tests/Procedures: Your physician has requested that you have an echocardiogram. Echocardiography is a painless test that uses sound waves to create images of your heart. It provides your doctor with information about the size and shape of your heart and how well your heart's chambers and valves are working. This procedure takes approximately one hour. There are no restrictions for this procedure. Please do NOT wear cologne, perfume, aftershave, or lotions (deodorant is allowed). Please arrive 15 minutes prior to your appointment time.  Please note: We ask at that you not bring children with you during ultrasound (echo/ vascular) testing. Due to room size and safety concerns, children are not allowed in the ultrasound rooms during exams. Our front office staff cannot provide observation of children in our lobby area while testing is being conducted. An adult accompanying a patient to their appointment will only be allowed in the ultrasound room at the discretion of the ultrasound technician under special circumstances. We apologize for any inconvenience.   Follow-Up: At [facility], you and your health needs are our priority.  As part of our continuing mission to provide you with exceptional heart care, we have created designated Provider Care Teams.  These Care Teams include your primary Cardiologist (physician) and Advanced Practice Providers (APPs -  Physician Assistants and Nurse Practitioners) who all work together to provide you with the care you need, when you need it.  Your next appointment:   1 year  Provider:   You may see OLE ONEIDA HOLTS, MD or one of the following Advanced Practice Providers on  your designated Care Team:   Charlies Arthur, PA-C Ozell Jodie Passey, PA-C Suzann Riddle, NP Daphne Barrack, NP   Other Instructions You have been referred to see a General Cardiologist in 6 months

## 2023-11-25 ENCOUNTER — Encounter: Payer: Self-pay | Admitting: Cardiology

## 2023-12-02 ENCOUNTER — Ambulatory Visit: Payer: Medicare Other | Attending: Cardiology

## 2023-12-02 DIAGNOSIS — I442 Atrioventricular block, complete: Secondary | ICD-10-CM | POA: Insufficient documentation

## 2023-12-02 DIAGNOSIS — I5022 Chronic systolic (congestive) heart failure: Secondary | ICD-10-CM | POA: Insufficient documentation

## 2023-12-02 DIAGNOSIS — Z950 Presence of cardiac pacemaker: Secondary | ICD-10-CM | POA: Insufficient documentation

## 2023-12-02 LAB — ECHOCARDIOGRAM COMPLETE
AR max vel: 3.02 cm2
AV Area VTI: 3.02 cm2
AV Area mean vel: 2.96 cm2
AV Mean grad: 3 mm[Hg]
AV Peak grad: 5.5 mm[Hg]
Ao pk vel: 1.17 m/s
S' Lateral: 5.3 cm
Single Plane A4C EF: 33 %

## 2023-12-02 MED ORDER — PERFLUTREN LIPID MICROSPHERE
1.0000 mL | INTRAVENOUS | Status: AC | PRN
Start: 2023-12-02 — End: 2023-12-02
  Administered 2023-12-02: 2 mL via INTRAVENOUS

## 2023-12-03 ENCOUNTER — Encounter: Payer: Self-pay | Admitting: Cardiology

## 2023-12-16 NOTE — Progress Notes (Signed)
 Remote pacemaker transmission.

## 2023-12-16 NOTE — Addendum Note (Signed)
Addended by: Elease Etienne A on: 12/16/2023 02:52 PM   Modules accepted: Orders

## 2024-02-04 ENCOUNTER — Ambulatory Visit: Payer: Medicare Other | Attending: *Deleted

## 2024-02-04 DIAGNOSIS — I442 Atrioventricular block, complete: Secondary | ICD-10-CM

## 2024-02-04 LAB — CUP PACEART REMOTE DEVICE CHECK
Battery Remaining Longevity: 82 mo
Battery Remaining Percentage: 95 %
Battery Voltage: 2.99 V
Brady Statistic AP VP Percent: 71 %
Brady Statistic AP VS Percent: 1.6 %
Brady Statistic AS VP Percent: 17 %
Brady Statistic AS VS Percent: 1 %
Brady Statistic RA Percent Paced: 62 %
Date Time Interrogation Session: 20250327040013
Lead Channel Impedance Value: 380 Ohm
Lead Channel Impedance Value: 530 Ohm
Lead Channel Impedance Value: 590 Ohm
Lead Channel Pacing Threshold Amplitude: 0.625 V
Lead Channel Pacing Threshold Amplitude: 0.625 V
Lead Channel Pacing Threshold Amplitude: 1 V
Lead Channel Pacing Threshold Pulse Width: 0.5 ms
Lead Channel Pacing Threshold Pulse Width: 0.5 ms
Lead Channel Pacing Threshold Pulse Width: 0.5 ms
Lead Channel Sensing Intrinsic Amplitude: 1 mV
Lead Channel Sensing Intrinsic Amplitude: 5.7 mV
Lead Channel Setting Pacing Amplitude: 1.625
Lead Channel Setting Pacing Amplitude: 2 V
Lead Channel Setting Pacing Amplitude: 2 V
Lead Channel Setting Pacing Pulse Width: 0.5 ms
Lead Channel Setting Pacing Pulse Width: 0.5 ms
Lead Channel Setting Sensing Sensitivity: 4 mV
Pulse Gen Model: 3222
Pulse Gen Serial Number: 8195471

## 2024-02-06 ENCOUNTER — Encounter: Payer: Self-pay | Admitting: Cardiology

## 2024-02-08 ENCOUNTER — Ambulatory Visit: Admitting: Cardiology

## 2024-03-16 NOTE — Progress Notes (Signed)
 Remote pacemaker transmission.

## 2024-04-18 ENCOUNTER — Telehealth: Payer: Self-pay | Admitting: Cardiology

## 2024-04-18 NOTE — Telephone Encounter (Signed)
 I called and spoke with the patient.  Per Mr. Scheffel report, he did not receive an Implant card after his procedure for his CRT upgrade on 08/03/23. He called [employer] and they sent him a card ~ 2-3 months later.  He reports he recently received a new card with a different serial #.   1st card received: Model #: [id number] Serial #: [id number]  Most recent card received: Model #: [id number] Serial #: [id number]  Reviewed the patient's procedure note from 08/03/23 with Dr. Marven Slimmer and confirmed: Device Placement:  The leads were then connected to a [facility] RF CRT-P generator (serial [id number]).   I have advised the patient that the 2nd ID card that was received for Serial # [id number] is correct and that he could shred the incorrect card.  The patient questioned as to why he was sent an incorrect card to start with. I advised the patient I was unsure how he received the 1st card with the incorrect information on it, but he is welcome to call [employer] and discuss this with them further. However, I have confirmed the SN in Dr. Candace Cerise op note and matches the SN in the [employer] Website.  The patient voices understanding and is agreeable.

## 2024-04-18 NOTE — Telephone Encounter (Signed)
 08/03/2023 replaced pacemaker. He called and asked for the pace maker card to be sent to him. He has received two different cards with two different information. Please advise.

## 2024-05-05 ENCOUNTER — Ambulatory Visit: Payer: Self-pay | Admitting: Cardiology

## 2024-05-05 ENCOUNTER — Ambulatory Visit (INDEPENDENT_AMBULATORY_CARE_PROVIDER_SITE_OTHER): Payer: Medicare Other

## 2024-05-05 DIAGNOSIS — I442 Atrioventricular block, complete: Secondary | ICD-10-CM

## 2024-05-05 LAB — CUP PACEART REMOTE DEVICE CHECK
Battery Remaining Longevity: 80 mo
Battery Remaining Percentage: 92 %
Battery Voltage: 2.99 V
Brady Statistic AP VP Percent: 76 %
Brady Statistic AP VS Percent: 1.9 %
Brady Statistic AS VP Percent: 15 %
Brady Statistic AS VS Percent: 1 %
Brady Statistic RA Percent Paced: 69 %
Date Time Interrogation Session: 20250626040014
Lead Channel Impedance Value: 430 Ohm
Lead Channel Impedance Value: 490 Ohm
Lead Channel Impedance Value: 640 Ohm
Lead Channel Pacing Threshold Amplitude: 0.625 V
Lead Channel Pacing Threshold Amplitude: 0.75 V
Lead Channel Pacing Threshold Amplitude: 1.125 V
Lead Channel Pacing Threshold Pulse Width: 0.5 ms
Lead Channel Pacing Threshold Pulse Width: 0.5 ms
Lead Channel Pacing Threshold Pulse Width: 0.5 ms
Lead Channel Sensing Intrinsic Amplitude: 1.5 mV
Lead Channel Sensing Intrinsic Amplitude: 9.6 mV
Lead Channel Setting Pacing Amplitude: 1.625
Lead Channel Setting Pacing Amplitude: 2 V
Lead Channel Setting Pacing Amplitude: 2.125
Lead Channel Setting Pacing Pulse Width: 0.5 ms
Lead Channel Setting Pacing Pulse Width: 0.5 ms
Lead Channel Setting Sensing Sensitivity: 4 mV
Pulse Gen Model: 3222
Pulse Gen Serial Number: 8195471

## 2024-07-05 ENCOUNTER — Ambulatory Visit: Attending: Cardiology | Admitting: Cardiology

## 2024-07-05 ENCOUNTER — Encounter: Payer: Self-pay | Admitting: Cardiology

## 2024-07-05 VITALS — BP 120/60 | HR 76 | Ht 72.0 in | Wt 276.4 lb

## 2024-07-05 DIAGNOSIS — I10 Essential (primary) hypertension: Secondary | ICD-10-CM | POA: Insufficient documentation

## 2024-07-05 DIAGNOSIS — I442 Atrioventricular block, complete: Secondary | ICD-10-CM | POA: Diagnosis present

## 2024-07-05 DIAGNOSIS — I5022 Chronic systolic (congestive) heart failure: Secondary | ICD-10-CM | POA: Diagnosis present

## 2024-07-05 DIAGNOSIS — I480 Paroxysmal atrial fibrillation: Secondary | ICD-10-CM | POA: Diagnosis present

## 2024-07-05 MED ORDER — SACUBITRIL-VALSARTAN 49-51 MG PO TABS: 1.0000 | ORAL_TABLET | Freq: Two times a day (BID) | ORAL | 11 refills | Status: DC

## 2024-07-05 MED ORDER — SPIRONOLACTONE 25 MG PO TABS
12.5000 mg | ORAL_TABLET | Freq: Every day | ORAL | 3 refills | Status: DC
Start: 2024-07-05 — End: 2024-07-05

## 2024-07-05 MED ORDER — SPIRONOLACTONE 25 MG PO TABS: 12.5000 mg | ORAL_TABLET | Freq: Every day | ORAL | 3 refills | Status: DC

## 2024-07-05 NOTE — Progress Notes (Signed)
 Cardiology Office Note:    Date:  07/05/2024   ID:  Bill Young, DOB [birth year], MRN [id number]  PCP:  Valora Agent, MD   [facility] Providers Cardiologist:  None Electrophysiologist:  OLE ONEIDA HOLTS, MD     Referring MD: HOLTS OLE ONEIDA, MD   Chief Complaint  Patient presents with   New Patient (Initial Visit)    Pt c/o legs & feet swollen and painful.    History of Present Illness:    Bill Young is a 78 y.o. 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  Has a history of complete heart block diagnosed in 2021.  Pacemaker upgraded to CRT-P on 06/2023 after his echocardiogram showed reduced LVEF previously 50 to 55%, reduced to 45 to 50%.  Last EF 30 to 35%.  He denies chest pain or shortness of breath.  Endorses leg edema which worsens as the day progresses or sitting for too long.  Denies any edema upon waking up.  Prior notes/studies TTE 8/24 EF 30 to 35% TTE 05/2021 EF 45 to 50% TTE 01/2020 EF 50 to 55%  Past Medical History:  Diagnosis Date   Complete heart block (HCC) 02/04/2020   Hyperlipidemia    Hypertension    Third degree heart block (HCC) 02/03/2020    Past Surgical History:  Procedure Laterality Date   BIV UPGRADE N/A 08/03/2023   Procedure: BIV UPGRADE;  Surgeon: HOLTS OLE ONEIDA, MD;  Location: [facility] INVASIVE CV LAB;  Service: Cardiovascular;  Laterality: N/A;   CARDIOVERSION N/A 10/09/2020   Procedure: CARDIOVERSION;  Surgeon: Raford Riggs, MD;  Location[facility] ENDOSCOPY;  Service: Cardiovascular;  Laterality: N/A;   PACEMAKER IMPLANT N/A 02/04/2020   Procedure: PACEMAKER IMPLANT;  Surgeon: Kelsie Agent, MD;  Location: [facility] INVASIVE CV LAB;  Service: Cardiovascular;  Laterality: N/A;   TEMPORARY PACEMAKER N/A  02/03/2020   Procedure: TEMPORARY PACEMAKER;  Surgeon: Ammon Blunt, MD;  Location: [facility] INVASIVE CV LAB;  Service: Cardiovascular;  Laterality: N/A;    Current Medications: Current Meds  Medication Sig   apixaban  (ELIQUIS ) 5 MG TABS tablet Take 1 tablet (5 mg total) by mouth 2 (two) times daily. Resume Sunday, 9/29   ascorbic acid (VITAMIN C) 500 MG tablet Take 500 mg by mouth 2 (two) times daily.   atorvastatin  (LIPITOR) 20 MG tablet Take 1 tablet by mouth at bedtime.   Cholecalciferol (VITAMIN D3) 50 MCG (2000 UT) capsule Take 2,000 Units by mouth in the morning and at bedtime.   furosemide  (LASIX ) 40 MG tablet Take 1 tablet by mouth daily for three days then take once daily as needed (Patient taking differently: Take 20 mg by mouth daily as needed for edema.)   metFORMIN (GLUCOPHAGE-XR) 500 MG 24 hr tablet Take 500 mg by mouth at bedtime.   [DISCONTINUED] losartan  (COZAAR ) 100 MG tablet Take 100 mg by mouth daily.   [DISCONTINUED] sacubitril -valsartan  (ENTRESTO ) 49-51 MG Take 1 tablet by mouth 2 (two) times daily.   [DISCONTINUED] spironolactone  (ALDACTONE ) 25 MG tablet Take 0.5 tablets (12.5 mg total) by mouth daily.     Allergies:   Epinephrine and Lidocaine    Social History   Socioeconomic History   Marital status: Married    Spouse name: Not on file   Number of children: Not on file   Years of education: Not  on file   Highest education level: Not on file  Occupational History   Not on file  Tobacco Use   Smoking status: Former    Current packs/day: 0.00    Types: Cigarettes    Quit date: 51    Years since quitting: 40.6   Smokeless tobacco: Never  Vaping Use   Vaping status: Never Used  Substance and Sexual Activity   Alcohol use: Never   Drug use: Never   Sexual activity: Not on file  Other Topics Concern   Not on file  Social History Narrative   Not on file   Social Drivers of Health   Financial Resource Strain: Low Risk  (02/02/2024)   Received  from [facility]   Overall Financial Resource Strain (CARDIA)    Difficulty of Paying Living Expenses: Not hard at all  Food Insecurity: No Food Insecurity (02/02/2024)   Received from [facility]   Hunger Vital Sign    Within the past 12 months, you worried that your food would run out before you got the money to buy more.: Never true    Within the past 12 months, the food you bought just didn't last and you didn't have money to get more.: Never true  Transportation Needs: No Transportation Needs (02/02/2024)   Received from [facility] - Transportation    In the past 12 months, has lack of transportation kept you from medical appointments or from getting medications?: No    Lack of Transportation (Non-Medical): No  Physical Activity: Not on file  Stress: Not on file  Social Connections: Not on file     Family History: The patient's family history is not on file.  ROS:   Please see the history of present illness.     All other systems reviewed and are negative.  EKGs/Labs/Other Studies Reviewed:    The following studies were reviewed today:  EKG Interpretation Date/Time:  Tuesday July 05 2024 13:49:32 EDT Ventricular Rate:  76 PR Interval:  208 QRS Duration:  118 QT Interval:  426 QTC Calculation: 479 R Axis:   -1  Text Interpretation: AV dual-paced rhythm Confirmed by Darliss Rogue ([id number]) on 07/05/2024 2:18:41 PM    Recent Labs: 07/21/2023: BUN 18; Creatinine, Ser 0.99; Hemoglobin 13.8; Platelets 206; Potassium 4.0; Sodium 144  Recent Lipid Panel No results found for: CHOL, TRIG, HDL, CHOLHDL, VLDL, LDLCALC, LDLDIRECT   Risk Assessment/Calculations:             Physical Exam:    VS:  BP 120/60 (BP Location: Left Arm, Patient Position: Sitting, Cuff Size: Normal)   Pulse 76   Ht 6' (1.829 m)   Wt 276 lb 6.4 oz (125.4 kg)   SpO2 99%   BMI 37.49 kg/m     Wt Readings from Last  3 Encounters:  07/05/24 276 lb 6.4 oz (125.4 kg)  11/18/23 297 lb 3.2 oz (134.8 kg)  08/04/23 290 lb (131.5 kg)     GEN:  Well nourished, well developed in no acute distress HEENT: Normal NECK: No JVD; No carotid bruits CARDIAC: RRR, no murmurs, rubs, gallops RESPIRATORY:  Clear to auscultation without rales, wheezing or rhonchi  ABDOMEN: Soft, non-tender, non-distended MUSCULOSKELETAL:  1+ edema; No deformity  SKIN: Warm and dry NEUROLOGIC:  Alert and oriented x 3 PSYCHIATRIC:  Normal affect   ASSESSMENT:    1. Chronic systolic heart failure (HCC)   2. Complete heart block (HCC)  3. Paroxysmal atrial fibrillation (HCC)   4. Primary hypertension    PLAN:    In order of problems listed above:  Cardiomyopathy, echo 8/24 EF 30 to 35%.  Presumably from cardiac dyssynchrony s/p CRT-P.  Stop losartan , start Entresto  49/51 mg twice daily, start Aldactone  12.5 mg daily.  Continue Lasix  40 mg daily.  Leg edema appears dependent.  Plan on repeat echo after appropriate GDMT. Complete heart block s/p CRT-P.  Appears to be functioning normally.  EKG showing AV dual paced rhythm.  Keep appointment with device clinic. Paroxysmal atrial fibrillation, continue Eliquis  5 mg twice daily. Hypertension, BP controlled.  Entresto , Aldactone  as above.  Follow-up in 6 to 8 weeks      Medication Adjustments/Labs and Tests Ordered: Current medicines are reviewed at length with the patient today.  Concerns regarding medicines are outlined above.  Orders Placed This Encounter  Procedures   Basic metabolic panel with GFR   EKG 87-Ozji   Meds ordered this encounter  Medications   DISCONTD: spironolactone  (ALDACTONE ) 25 MG tablet    Sig: Take 0.5 tablets (12.5 mg total) by mouth daily.    Dispense:  45 tablet    Refill:  3   DISCONTD: sacubitril -valsartan  (ENTRESTO ) 49-51 MG    Sig: Take 1 tablet by mouth 2 (two) times daily.    Dispense:  60 tablet    Refill:  11   spironolactone  (ALDACTONE )  25 MG tablet    Sig: Take 0.5 tablets (12.5 mg total) by mouth daily.    Dispense:  45 tablet    Refill:  3   sacubitril -valsartan  (ENTRESTO ) 49-51 MG    Sig: Take 1 tablet by mouth 2 (two) times daily.    Dispense:  60 tablet    Refill:  11    Patient Instructions  Medication Instructions:  Your physician has recommended you make the following change in your medication:   STOP Losartan  START Entresto  49-51 mg twice daily  START Aldactone  (Spironolactone ) 12.5 mg once daily   *If you need a refill on your cardiac medications before your next appointment, please call your pharmacy*  Lab Work: [facility] in 10 days here in our office. No appointment is needed and they are open 8-4 and closed daily for lunch between 1-2.  If you have labs (blood work) drawn today and your tests are completely normal, you will receive your results only by: MyChart Message (if you have MyChart) OR A paper copy in the mail If you have any lab test that is abnormal or we need to change your treatment, we will call you to review the results.  Testing/Procedures: None  Follow-Up: At [facility], you and your health needs are our priority.  As part of our continuing mission to provide you with exceptional heart care, our providers are all part of one team.  This team includes your primary Cardiologist (physician) and Advanced Practice Providers or APPs (Physician Assistants and Nurse Practitioners) who all work together to provide you with the care you need, when you need it.  Your next appointment:   2 month(s)  Provider:   Redell Cave, MD     Signed, Redell Cave, MD  07/05/2024 3:09 PM    [facility]

## 2024-07-05 NOTE — Patient Instructions (Addendum)
 Medication Instructions:  Your physician has recommended you make the following change in your medication:   STOP Losartan  START Entresto  49-51 mg twice daily  START Aldactone  (Spironolactone ) 12.5 mg once daily   *If you need a refill on your cardiac medications before your next appointment, please call your pharmacy*  Lab Work: [facility] in 10 days here in our office. No appointment is needed and they are open 8-4 and closed daily for lunch between 1-2.  If you have labs (blood work) drawn today and your tests are completely normal, you will receive your results only by: MyChart Message (if you have MyChart) OR A paper copy in the mail If you have any lab test that is abnormal or we need to change your treatment, we will call you to review the results.  Testing/Procedures: None  Follow-Up: At [facility], you and your health needs are our priority.  As part of our continuing mission to provide you with exceptional heart care, our providers are all part of one team.  This team includes your primary Cardiologist (physician) and Advanced Practice Providers or APPs (Physician Assistants and Nurse Practitioners) who all work together to provide you with the care you need, when you need it.  Your next appointment:   2 month(s)  Provider:   Redell Cave, MD

## 2024-07-22 ENCOUNTER — Ambulatory Visit: Payer: Self-pay | Admitting: Cardiology

## 2024-07-22 LAB — BASIC METABOLIC PANEL WITH GFR
BUN/Creatinine Ratio: 19 (ref 10–24)
BUN: 21 mg/dL (ref 8–27)
CO2: 22 mmol/L (ref 20–29)
Calcium: 10.3 mg/dL — ABNORMAL HIGH (ref 8.6–10.2)
Chloride: 99 mmol/L (ref 96–106)
Creatinine, Ser: 1.12 mg/dL (ref 0.76–1.27)
Glucose: 105 mg/dL — ABNORMAL HIGH (ref 70–99)
Potassium: 4.2 mmol/L (ref 3.5–5.2)
Sodium: 142 mmol/L (ref 134–144)
eGFR: 67 mL/min/1.73 (ref >59)

## 2024-08-04 ENCOUNTER — Ambulatory Visit (INDEPENDENT_AMBULATORY_CARE_PROVIDER_SITE_OTHER): Payer: Medicare Other

## 2024-08-04 DIAGNOSIS — I442 Atrioventricular block, complete: Secondary | ICD-10-CM | POA: Diagnosis not present

## 2024-08-05 LAB — CUP PACEART REMOTE DEVICE CHECK
Battery Remaining Longevity: 77 mo
Battery Remaining Percentage: 88 %
Battery Voltage: 2.99 V
Brady Statistic AP VP Percent: 76 %
Brady Statistic AP VS Percent: 1.9 %
Brady Statistic AS VP Percent: 15 %
Brady Statistic AS VS Percent: 1 %
Brady Statistic RA Percent Paced: 71 %
Date Time Interrogation Session: 20250925040015
Lead Channel Impedance Value: 430 Ohm
Lead Channel Impedance Value: 490 Ohm
Lead Channel Impedance Value: 650 Ohm
Lead Channel Pacing Threshold Amplitude: 0.625 V
Lead Channel Pacing Threshold Amplitude: 0.75 V
Lead Channel Pacing Threshold Amplitude: 1.125 V
Lead Channel Pacing Threshold Pulse Width: 0.5 ms
Lead Channel Pacing Threshold Pulse Width: 0.5 ms
Lead Channel Pacing Threshold Pulse Width: 0.5 ms
Lead Channel Sensing Intrinsic Amplitude: 0.9 mV
Lead Channel Sensing Intrinsic Amplitude: 9.3 mV
Lead Channel Setting Pacing Amplitude: 1.625
Lead Channel Setting Pacing Amplitude: 2 V
Lead Channel Setting Pacing Amplitude: 2.125
Lead Channel Setting Pacing Pulse Width: 0.5 ms
Lead Channel Setting Pacing Pulse Width: 0.5 ms
Lead Channel Setting Sensing Sensitivity: 4 mV
Pulse Gen Model: 3222
Pulse Gen Serial Number: 8195471

## 2024-08-08 NOTE — Progress Notes (Signed)
 Remote pacemaker transmission.

## 2024-08-10 NOTE — Progress Notes (Signed)
 Remote PPM Transmission

## 2024-08-12 ENCOUNTER — Ambulatory Visit: Payer: Self-pay | Admitting: Cardiology

## 2024-09-05 ENCOUNTER — Encounter: Payer: Self-pay | Admitting: Cardiology

## 2024-09-05 ENCOUNTER — Ambulatory Visit: Attending: Cardiology | Admitting: Cardiology

## 2024-09-05 VITALS — BP 130/68 | HR 75 | Ht 72.0 in | Wt 263.6 lb

## 2024-09-05 DIAGNOSIS — I480 Paroxysmal atrial fibrillation: Secondary | ICD-10-CM | POA: Diagnosis not present

## 2024-09-05 DIAGNOSIS — Z950 Presence of cardiac pacemaker: Secondary | ICD-10-CM | POA: Diagnosis not present

## 2024-09-05 DIAGNOSIS — I10 Essential (primary) hypertension: Secondary | ICD-10-CM | POA: Diagnosis present

## 2024-09-05 DIAGNOSIS — I5022 Chronic systolic (congestive) heart failure: Secondary | ICD-10-CM | POA: Diagnosis present

## 2024-09-05 MED ORDER — FUROSEMIDE 40 MG PO TABS: 20.0000 mg | ORAL_TABLET | Freq: Every day | ORAL | Status: AC | PRN

## 2024-09-05 MED ORDER — SACUBITRIL-VALSARTAN 49-51 MG PO TABS: 1.0000 | ORAL_TABLET | Freq: Two times a day (BID) | ORAL | 11 refills | Status: AC

## 2024-09-05 MED ORDER — SPIRONOLACTONE 25 MG PO TABS: 12.5000 mg | ORAL_TABLET | Freq: Every day | ORAL | 3 refills | Status: DC

## 2024-09-05 NOTE — Patient Instructions (Signed)
 Medication Instructions:  Your physician recommends that you continue on your current medications as directed. Please refer to the Current Medication list given to you today.    *If you need a refill on your cardiac medications before your next appointment, please call your pharmacy*  Lab Work: No labs ordered today  If you have labs (blood work) drawn today and your tests are completely normal, you will receive your results only by: MyChart Message (if you have MyChart) OR A paper copy in the mail If you have any lab test that is abnormal or we need to change your treatment, we will call you to review the results.  Testing/Procedures: Your physician has requested that you have an echocardiogram. Echocardiography is a painless test that uses sound waves to create images of your heart. It provides your doctor with information about the size and shape of your heart and how well your heart's chambers and valves are working.   You may receive an ultrasound enhancing agent through an IV if needed to better visualize your heart during the echo. This procedure takes approximately one hour.  There are no restrictions for this procedure.  This will take place at [address] [facility] [address], [place] [address]  Please note: We ask at that you not bring children with you during ultrasound (echo/ vascular) testing. Due to room size and safety concerns, children are not allowed in the ultrasound rooms during exams. Our front office staff cannot provide observation of children in our lobby area while testing is being conducted. An adult accompanying a patient to their appointment will only be allowed in the ultrasound room at the discretion of the ultrasound technician under special circumstances. We apologize for any inconvenience.   Follow-Up: At [facility], you and your health needs are our priority.  As part of our continuing mission to provide you with exceptional heart  care, our providers are all part of one team.  This team includes your primary Cardiologist (physician) and Advanced Practice Providers or APPs (Physician Assistants and Nurse Practitioners) who all work together to provide you with the care you need, when you need it.  Your next appointment:   4 month(s)  Provider:   You may see Dr. Darliss or one of the following Advanced Practice Providers on your designated Care Team:   Lonni Meager, NP Lesley Maffucci, PA-C Bernardino Bring, PA-C Cadence [place], PA-C Tylene Lunch, NP Barnie Hila, NP    We recommend signing up for the patient portal called MyChart.  Sign up information is provided on this After Visit Summary.  MyChart is used to connect with patients for Virtual Visits (Telemedicine).  Patients are able to view lab/test results, encounter notes, upcoming appointments, etc.  Non-urgent messages can be sent to your provider as well.   To learn more about what you can do with MyChart, go to [email or web address].

## 2024-09-05 NOTE — Progress Notes (Signed)
 Cardiology Office Note:    Date:  09/05/2024   ID:  Bill Young, DOB 19-Jul-1946, MRN [id number]  PCP:  Valora Lynwood FALCON, MD   [facility] Providers Cardiologist:  None Electrophysiologist:  OLE ONEIDA HOLTS, MD     Referring MD: Valora Lynwood FALCON, MD   No chief complaint on file.   History of Present Illness:    Bill Young is a 78 y.o. 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  Last seen due to cardiomyopathy, losartan  was stopped, patient started on Entresto .  Tolerating Entresto  with no adverse effects.  Edema adequately controlled with Lasix  20 mg daily.  Also compliant with spironolactone .  Feels well, has no concerns at this time.  Prior notes/studies Echo 1/25 EF 30 to 35% TTE 8/24 EF 30 to 35% TTE 05/2021 EF 45 to 50% TTE 01/2020 EF 50 to 55%  Past Medical History:  Diagnosis Date   Complete heart block (HCC) 02/04/2020   Hyperlipidemia    Hypertension    Third degree heart block (HCC) 02/03/2020    Past Surgical History:  Procedure Laterality Date   BIV UPGRADE N/A 08/03/2023   Procedure: BIV UPGRADE;  Surgeon: Holts Ole ONEIDA, MD;  Location: [facility] INVASIVE CV LAB;  Service: Cardiovascular;  Laterality: N/A;   CARDIOVERSION N/A 10/09/2020   Procedure: CARDIOVERSION;  Surgeon: Raford Riggs, MD;  Location: [facility] ENDOSCOPY;  Service: Cardiovascular;  Laterality: N/A;   PACEMAKER IMPLANT N/A 02/04/2020   Procedure: PACEMAKER IMPLANT;  Surgeon: Kelsie Lynwood, MD;  Location: [facility] INVASIVE CV LAB;  Service: Cardiovascular;  Laterality: N/A;   TEMPORARY PACEMAKER N/A 02/03/2020   Procedure: TEMPORARY PACEMAKER;  Surgeon: Ammon Blunt, MD;  Location: [facility] INVASIVE CV LAB;  Service: Cardiovascular;  Laterality: N/A;    Current  Medications: Current Meds  Medication Sig   apixaban  (ELIQUIS ) 5 MG TABS tablet Take 1 tablet (5 mg total) by mouth 2 (two) times daily. Resume Sunday, 9/29   ascorbic acid (VITAMIN C) 500 MG tablet Take 500 mg by mouth 2 (two) times daily.   atorvastatin  (LIPITOR) 20 MG tablet Take 1 tablet by mouth at bedtime.   Cholecalciferol (VITAMIN D3) 50 MCG (2000 UT) capsule Take 2,000 Units by mouth in the morning and at bedtime.   metFORMIN (GLUCOPHAGE-XR) 500 MG 24 hr tablet Take 500 mg by mouth at bedtime.   [DISCONTINUED] furosemide  (LASIX ) 40 MG tablet Take 1 tablet by mouth daily for three days then take once daily as needed (Patient taking differently: Take 20 mg by mouth daily as needed for edema.)   [DISCONTINUED] sacubitril -valsartan  (ENTRESTO ) 49-51 MG Take 1 tablet by mouth 2 (two) times daily.   [DISCONTINUED] spironolactone  (ALDACTONE ) 25 MG tablet Take 0.5 tablets (12.5 mg total) by mouth daily.     Allergies:   Epinephrine and Lidocaine    Social History   Socioeconomic History   Marital status: Married    Spouse name: Not on file   Number of children: Not on file   Years of education: Not on file   Highest education level: Not on file  Occupational History   Not on file  Tobacco Use   Smoking status: Former    Current packs/day: 0.00    Types: Cigarettes    Quit date: 1985    Years since quitting: 40.8   Smokeless  tobacco: Never  Vaping Use   Vaping status: Never Used  Substance and Sexual Activity   Alcohol use: Never   Drug use: Never   Sexual activity: Not on file  Other Topics Concern   Not on file  Social History Narrative   Not on file   Social Drivers of Health   Financial Resource Strain: Low Risk  (02/02/2024)   Received from [facility]   Overall Financial Resource Strain (CARDIA)    Difficulty of Paying Living Expenses: Not hard at all  Food Insecurity: No Food Insecurity (02/02/2024)   Received from [facility]    Hunger Vital Sign    Within the past 12 months, you worried that your food would run out before you got the money to buy more.: Never true    Within the past 12 months, the food you bought just didn't last and you didn't have money to get more.: Never true  Transportation Needs: No Transportation Needs (02/02/2024)   Received from [facility] - Transportation    In the past 12 months, has lack of transportation kept you from medical appointments or from getting medications?: No    Lack of Transportation (Non-Medical): No  Physical Activity: Not on file  Stress: Not on file  Social Connections: Not on file     Family History: The patient's family history is not on file.  ROS:   Please see the history of present illness.     All other systems reviewed and are negative.  EKGs/Labs/Other Studies Reviewed:    The following studies were reviewed today:       Recent Labs: 07/21/2024: BUN 21; Creatinine, Ser 1.12; Potassium 4.2; Sodium 142  Recent Lipid Panel No results found for: CHOL, TRIG, HDL, CHOLHDL, VLDL, LDLCALC, LDLDIRECT   Risk Assessment/Calculations:             Physical Exam:    VS:  BP 130/68 (BP Location: Left Arm, Patient Position: Sitting, Cuff Size: Normal)   Pulse 75   Ht 6' (1.829 m)   Wt 263 lb 9.6 oz (119.6 kg)   SpO2 97%   BMI 35.75 kg/m     Wt Readings from Last 3 Encounters:  09/05/24 263 lb 9.6 oz (119.6 kg)  07/05/24 276 lb 6.4 oz (125.4 kg)  11/18/23 297 lb 3.2 oz (134.8 kg)     GEN:  Well nourished, well developed in no acute distress HEENT: Normal NECK: No JVD; No carotid bruits CARDIAC: RRR, no murmurs, rubs, gallops RESPIRATORY:  Clear to auscultation without rales, wheezing or rhonchi  ABDOMEN: Soft, non-tender, non-distended MUSCULOSKELETAL:  1+ edema; No deformity  SKIN: Warm and dry NEUROLOGIC:  Alert and oriented x 3 PSYCHIATRIC:  Normal affect   ASSESSMENT:    1. Chronic systolic  heart failure (HCC)   2. Pacemaker   3. Paroxysmal atrial fibrillation (HCC)   4. Primary hypertension     PLAN:    In order of problems listed above:  Cardiomyopathy, echo 1/25 EF 30 to 35%.  Presumably from cardiac dyssynchrony s/p CRT-P.  Continue Entresto  49/51 mg twice daily, Aldactone  12.5 mg daily, Lasix  20 mg daily.  Obtain limited echo in 3 months. Complete heart block s/p CRT-P.  Appears to be functioning normally.  Monitoring as per device clinic. Paroxysmal atrial fibrillation, continue Eliquis  5 mg twice daily. Hypertension, BP controlled.  Entresto  49/51, Aldactone  12.5 mg daily.    Follow-up in 4 to 5  months      Medication Adjustments/Labs and Tests Ordered: Current medicines are reviewed at length with the patient today.  Concerns regarding medicines are outlined above.  Orders Placed This Encounter  Procedures   ECHOCARDIOGRAM LIMITED   Meds ordered this encounter  Medications   spironolactone  (ALDACTONE ) 25 MG tablet    Sig: Take 0.5 tablets (12.5 mg total) by mouth daily.    Dispense:  45 tablet    Refill:  3   furosemide  (LASIX ) 40 MG tablet    Sig: Take 0.5 tablets (20 mg total) by mouth daily as needed for edema.   sacubitril -valsartan  (ENTRESTO ) 49-51 MG    Sig: Take 1 tablet by mouth 2 (two) times daily.    Dispense:  60 tablet    Refill:  11    Patient Instructions  Medication Instructions:  Your physician recommends that you continue on your current medications as directed. Please refer to the Current Medication list given to you today.   *If you need a refill on your cardiac medications before your next appointment, please call your pharmacy*  Lab Work: No labs ordered today  If you have labs (blood work) drawn today and your tests are completely normal, you will receive your results only by: MyChart Message (if you have MyChart) OR A paper copy in the mail If you have any lab test that is abnormal or we need to change your treatment, we  will call you to review the results.  Testing/Procedures: Your physician has requested that you have an echocardiogram. Echocardiography is a painless test that uses sound waves to create images of your heart. It provides your doctor with information about the size and shape of your heart and how well your heart's chambers and valves are working.   You may receive an ultrasound enhancing agent through an IV if needed to better visualize your heart during the echo. This procedure takes approximately one hour.  There are no restrictions for this procedure.  This will take place at [address] [facility] [address], [place] [address]  Please note: We ask at that you not bring children with you during ultrasound (echo/ vascular) testing. Due to room size and safety concerns, children are not allowed in the ultrasound rooms during exams. Our front office staff cannot provide observation of children in our lobby area while testing is being conducted. An adult accompanying a patient to their appointment will only be allowed in the ultrasound room at the discretion of the ultrasound technician under special circumstances. We apologize for any inconvenience.   Follow-Up: At [facility], you and your health needs are our priority.  As part of our continuing mission to provide you with exceptional heart care, our providers are all part of one team.  This team includes your primary Cardiologist (physician) and Advanced Practice Providers or APPs (Physician Assistants and Nurse Practitioners) who all work together to provide you with the care you need, when you need it.  Your next appointment:   4 month(s)  Provider:   You may see Dr. Darliss or one of the following Advanced Practice Providers on your designated Care Team:   Lonni Meager, NP Lesley Maffucci, PA-C Bernardino Bring, PA-C Cadence Wilkeson, PA-C Tylene Lunch, NP Barnie Hila, NP    We recommend signing up for  the patient portal called MyChart.  Sign up information is provided on this After Visit Summary.  MyChart is used to connect with patients for Virtual Visits (Telemedicine).  Patients are able to view lab/test results, encounter notes, upcoming appointments, etc.  Non-urgent messages can be sent to your provider as well.   To learn more about what you can do with MyChart, go to [email or web address].             Signed, Redell Cave, MD  09/05/2024 3:18 PM    [facility]

## 2024-11-04 ENCOUNTER — Ambulatory Visit: Payer: Medicare Other

## 2024-11-04 DIAGNOSIS — I442 Atrioventricular block, complete: Secondary | ICD-10-CM | POA: Diagnosis not present

## 2024-11-06 ENCOUNTER — Ambulatory Visit: Payer: Self-pay | Admitting: Cardiology

## 2024-11-06 LAB — CUP PACEART REMOTE DEVICE CHECK
Battery Remaining Longevity: 74 mo
Battery Remaining Percentage: 85 %
Battery Voltage: 2.99 V
Brady Statistic AP VP Percent: 77 %
Brady Statistic AP VS Percent: 2 %
Brady Statistic AS VP Percent: 14 %
Brady Statistic AS VS Percent: 1 %
Brady Statistic RA Percent Paced: 73 %
Date Time Interrogation Session: 20251226020016
Lead Channel Impedance Value: 430 Ohm
Lead Channel Impedance Value: 490 Ohm
Lead Channel Impedance Value: 630 Ohm
Lead Channel Pacing Threshold Amplitude: 0.625 V
Lead Channel Pacing Threshold Amplitude: 0.75 V
Lead Channel Pacing Threshold Amplitude: 1 V
Lead Channel Pacing Threshold Pulse Width: 0.5 ms
Lead Channel Pacing Threshold Pulse Width: 0.5 ms
Lead Channel Pacing Threshold Pulse Width: 0.5 ms
Lead Channel Sensing Intrinsic Amplitude: 0.8 mV
Lead Channel Sensing Intrinsic Amplitude: 7.3 mV
Lead Channel Setting Pacing Amplitude: 1.75 V
Lead Channel Setting Pacing Amplitude: 2 V
Lead Channel Setting Pacing Amplitude: 2 V
Lead Channel Setting Pacing Pulse Width: 0.5 ms
Lead Channel Setting Pacing Pulse Width: 0.5 ms
Lead Channel Setting Sensing Sensitivity: 4 mV
Pulse Gen Model: 3222
Pulse Gen Serial Number: 8195471

## 2024-11-07 ENCOUNTER — Ambulatory Visit: Attending: Cardiology

## 2024-11-07 DIAGNOSIS — I5022 Chronic systolic (congestive) heart failure: Secondary | ICD-10-CM | POA: Insufficient documentation

## 2024-11-07 LAB — ECHOCARDIOGRAM LIMITED
AV Mean grad: 3 mmHg
AV Peak grad: 5.6 mmHg
Ao pk vel: 1.18 m/s
Area-P 1/2: 3.08 cm2
S' Lateral: 4.52 cm

## 2024-11-07 MED ORDER — PERFLUTREN LIPID MICROSPHERE
1.0000 mL | INTRAVENOUS | Status: AC | PRN
  Administered 2024-11-07: 3 mL via INTRAVENOUS

## 2024-11-08 ENCOUNTER — Ambulatory Visit: Payer: Self-pay | Admitting: Cardiology

## 2024-11-08 DIAGNOSIS — I5022 Chronic systolic (congestive) heart failure: Secondary | ICD-10-CM

## 2024-11-09 NOTE — Progress Notes (Signed)
 Remote PPM Transmission

## 2024-11-14 ENCOUNTER — Other Ambulatory Visit: Payer: Self-pay

## 2024-11-14 MED ORDER — SPIRONOLACTONE 25 MG PO TABS: 25.0000 mg | ORAL_TABLET | Freq: Every day | ORAL | 3 refills | Status: AC

## 2024-11-14 MED ORDER — METOPROLOL SUCCINATE ER 25 MG PO TB24: 25.0000 mg | ORAL_TABLET | Freq: Every day | ORAL | 3 refills | Status: AC

## 2024-11-15 ENCOUNTER — Telehealth: Payer: Self-pay | Admitting: Cardiology

## 2024-11-15 NOTE — Telephone Encounter (Signed)
 Called patient regarding echocardiogram questions. Patient had questions about the medication and the heart failure clinic referral. Went over in depth with patient the medication add on/changes and reason for heart failure clinic referral. Patient verbalized understanding. No further needs at this time.

## 2024-11-15 NOTE — Telephone Encounter (Signed)
Patient calling in about his results. Please advise 

## 2025-01-06 ENCOUNTER — Ambulatory Visit: Admitting: Cardiology

## 2025-02-21 ENCOUNTER — Ambulatory Visit: Admitting: Cardiology
